# Patient Record
Sex: Male | Born: 1995 | Race: White | Hispanic: No | Marital: Married | Smoking: Former smoker
Health system: Southern US, Community
[De-identification: ages and names within clinical notes are randomized; demographics above are authoritative.]

## PROBLEM LIST (undated history)

## (undated) DIAGNOSIS — S060XAA Concussion with loss of consciousness status unknown, initial encounter: Secondary | ICD-10-CM

## (undated) DIAGNOSIS — S060X9A Concussion with loss of consciousness of unspecified duration, initial encounter: Secondary | ICD-10-CM

---
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
## 2016-01-12 ENCOUNTER — Emergency Department (HOSPITAL_COMMUNITY)
Admission: EM | Admit: 2016-01-12 | Discharge: 2016-01-13 | Disposition: A | Discharge status: Home / Self Care | Payer: 59 | Attending: Emergency Medicine | Admitting: Emergency Medicine

## 2016-01-12 ENCOUNTER — Encounter (HOSPITAL_COMMUNITY): Payer: Self-pay | Admitting: *Deleted

## 2016-01-12 DIAGNOSIS — H6593 Unspecified nonsuppurative otitis media, bilateral: Secondary | ICD-10-CM | POA: Diagnosis not present

## 2016-01-12 DIAGNOSIS — J069 Acute upper respiratory infection, unspecified: Secondary | ICD-10-CM | POA: Diagnosis not present

## 2016-01-12 DIAGNOSIS — F17210 Nicotine dependence, cigarettes, uncomplicated: Secondary | ICD-10-CM | POA: Diagnosis not present

## 2016-01-12 DIAGNOSIS — J45901 Unspecified asthma with (acute) exacerbation: Secondary | ICD-10-CM | POA: Insufficient documentation

## 2016-01-12 DIAGNOSIS — Z792 Long term (current) use of antibiotics: Secondary | ICD-10-CM | POA: Diagnosis not present

## 2016-01-12 DIAGNOSIS — Z79899 Other long term (current) drug therapy: Secondary | ICD-10-CM | POA: Diagnosis not present

## 2016-01-12 DIAGNOSIS — R05 Cough: Secondary | ICD-10-CM | POA: Diagnosis not present

## 2016-01-12 DIAGNOSIS — B9789 Other viral agents as the cause of diseases classified elsewhere: Secondary | ICD-10-CM

## 2016-01-12 NOTE — ED Triage Notes (Signed)
Pt c/o cough with some sob and congestion x 1 day

## 2016-01-13 ENCOUNTER — Emergency Department (HOSPITAL_COMMUNITY): Payer: 59

## 2016-01-13 DIAGNOSIS — Z792 Long term (current) use of antibiotics: Secondary | ICD-10-CM | POA: Diagnosis not present

## 2016-01-13 DIAGNOSIS — R05 Cough: Secondary | ICD-10-CM | POA: Diagnosis not present

## 2016-01-13 DIAGNOSIS — H6593 Unspecified nonsuppurative otitis media, bilateral: Secondary | ICD-10-CM | POA: Diagnosis not present

## 2016-01-13 DIAGNOSIS — Z79899 Other long term (current) drug therapy: Secondary | ICD-10-CM | POA: Diagnosis not present

## 2016-01-13 DIAGNOSIS — J069 Acute upper respiratory infection, unspecified: Secondary | ICD-10-CM | POA: Diagnosis not present

## 2016-01-13 DIAGNOSIS — J45901 Unspecified asthma with (acute) exacerbation: Secondary | ICD-10-CM | POA: Diagnosis not present

## 2016-01-13 DIAGNOSIS — F17210 Nicotine dependence, cigarettes, uncomplicated: Secondary | ICD-10-CM | POA: Diagnosis not present

## 2016-01-13 MED ORDER — AMOXICILLIN-POT CLAVULANATE 875-125 MG PO TABS
1.0000 | ORAL_TABLET | Freq: Once | ORAL | Status: AC
  Administered 2016-01-13: 1 via ORAL
  Filled 2016-01-13: qty 1

## 2016-01-13 MED ORDER — ALBUTEROL SULFATE HFA 108 (90 BASE) MCG/ACT IN AERS: 1.0000 | INHALATION_SPRAY | RESPIRATORY_TRACT | 0 refills | Status: DC | PRN

## 2016-01-13 MED ORDER — PREDNISONE 20 MG PO TABS: 40.0000 mg | ORAL_TABLET | Freq: Every day | ORAL | 0 refills | Status: DC

## 2016-01-13 MED ORDER — AMOXICILLIN-POT CLAVULANATE 875-125 MG PO TABS: 1.0000 | ORAL_TABLET | Freq: Two times a day (BID) | ORAL | 0 refills | Status: DC

## 2016-01-13 MED ORDER — DEXTROMETHORPHAN-GUAIFENESIN 15-400 MG PO TABS: 1.0000 | ORAL_TABLET | ORAL | 0 refills | Status: DC

## 2016-01-13 NOTE — ED Provider Notes (Signed)
[facility]-EMER[facility]Y DEPT Provider Note   CSN: [id number] Arrival date & time: 01/12/16  2330     History   Chief Complaint Chief Complaint  Patient presents with  . Cough    HPI  Alexander Villanueva is a 20 y.o. male who complains of congestion, sore throat, dry cough, myalgias, chills, bilateral ear fullness, pressure and hoarseness for 1 week. He denies a history of fevers, rash  and shortness of breath and has a history of asthma. Patient admits to smoking cigarettes.     HPI  Past Medical History:  Diagnosis Date  . Asthma   . Concussion     There are no active problems to display for this patient.   History reviewed. No pertinent surgical history.     Home Medications    Prior to Admission medications   Medication Sig Start Date End Date Taking? Authorizing Provider  acetaminophen (TYLENOL) 325 MG tablet Take 650 mg by mouth every 6 (six) hours as needed for fever.    Historical Provider, MD  albuterol (PROVENTIL HFA;VENTOLIN HFA) 108 (90 Base) MCG/ACT inhaler Inhale 1-2 puffs into the lungs every 4 (four) hours as needed for wheezing or shortness of breath. 01/13/16   Arthor Captain, PA-C  amoxicillin-clavulanate (AUGMENTIN) 875-125 MG tablet Take 1 tablet by mouth 2 (two) times daily. One po bid x 7 days 01/13/16   Arthor Captain, PA-C  chlorpheniramine-HYDROcodone [facility] ER) 10-8 MG/5ML LQCR Take 5 mLs by mouth every 12 (twelve) hours as needed for cough. Patient not taking: Reported on 09/24/2014 07/06/14   Janne Napoleon, NP  Dextromethorphan-Guaifenesin 15-400 MG TABS Take 1 tablet by mouth every 4 (four) hours. 01/13/16   Delenn Ahn, PA-C  ondansetron (ZOFRAN ODT) 4 MG disintegrating tablet Take 1 tablet (4 mg total) by mouth every 8 (eight) hours as needed for nausea or vomiting. 02/25/15   Laurence Spates, MD  predniSONE (DELTASONE) 20 MG tablet Take 2 tablets (40 mg total) by mouth daily. 01/13/16   Arthor Captain, PA-C    Family  History History reviewed. No pertinent family history.  Social History Social History  Substance Use Topics  . Smoking status: Current Some Day Smoker    Packs/day: 0.50    Types: Cigarettes  . Smokeless tobacco: Never Used  . Alcohol use No     Allergies   Review of patient's allergies indicates no known allergies.   Review of Systems Review of Systems  Ten systems reviewed and are negative for acute change, except as noted in the HPI.   Physical Exam Updated Vital Signs BP 115/81 (BP Location: Right Arm)   Pulse 95   Temp 99.4 F (37.4 C) (Oral)   Resp 20   Ht 5\' 7"  (1.702 m)   Wt 122.5 kg   SpO2 95%   BMI 42.29 kg/m   Physical Exam  Constitutional: He is oriented to person, place, and time. He appears well-developed and well-nourished. No distress.  HENT:  Head: Normocephalic and atraumatic.  Right Ear: A middle ear effusion is present.  Left Ear: A middle ear effusion is present.  Mouth/Throat: Uvula is midline. Posterior oropharyngeal erythema present. No oropharyngeal exudate or posterior oropharyngeal edema.  Eyes: Conjunctivae and EOM are normal. Pupils are equal, round, and reactive to light. No scleral icterus.  Neck: Normal range of motion. Neck supple.  Cardiovascular: Normal rate, regular rhythm and normal heart sounds.   Pulmonary/Chest: Effort normal and breath sounds normal. No respiratory distress. He has no wheezes.  Abdominal: Soft. He exhibits no distension. There is no tenderness.  Musculoskeletal: He exhibits no edema.  Neurological: He is alert and oriented to person, place, and time.  Skin: Skin is warm and dry. He is not diaphoretic.  Psychiatric: His behavior is normal.  Nursing note and vitals reviewed.    ED Treatments / Results  Labs (all labs ordered are listed, but only abnormal results are displayed) Labs Reviewed - No data to display  EKG  EKG Interpretation None       Radiology No results  found.  Procedures Procedures (including critical care time)  Medications Ordered in ED Medications - No data to display   Initial Impression / Assessment and Plan / ED Course  I have reviewed the triage vital signs and the nursing notes.  Pertinent labs & imaging results that were available during my care of the patient were reviewed by me and considered in my medical decision making (see chart for details).  Clinical Course    Pt CXR negative for acute infiltrate. Patients symptoms are consistent with URI, likely viral etiology. Patient  exam also consistent with serous otitis media. No concern for acute mastoiditis, meningitis. n.  Patient discharged home with Augmentin.  Advised to call PCP today for follow-up.  I have also discussed reasons to return immediately to the ER.  Patient expresses understanding and agrees with plan.      Final Clinical Impressions(s) / ED Diagnoses   Final diagnoses:  Viral URI with cough  Asthma exacerbation  OME (otitis media with effusion), bilateral    New Prescriptions New Prescriptions   AMOXICILLIN-CLAVULANATE (AUGMENTIN) 875-125 MG TABLET    Take 1 tablet by mouth 2 (two) times daily. One po bid x 7 days   DEXTROMETHORPHAN-GUAIFENESIN 15-400 MG TABS    Take 1 tablet by mouth every 4 (four) hours.   PREDNISONE (DELTASONE) 20 MG TABLET    Take 2 tablets (40 mg total) by mouth daily.     Arthor CaptainAbigail Javonnie Illescas, PA-C 01/13/16 0129    Geoffery Lyonsouglas Delo, MD 01/14/16 [phone]

## 2016-01-13 NOTE — ED Notes (Signed)
Pt reports that he works in [employer] service- That he has had a cough since Friday that is unproductive and his head feels tightness all over- He is non tender to palpation over his sinus area, His lungs are diminished L greater than R base

## 2016-01-13 NOTE — ED Notes (Signed)
Patient transported to X-ray 

## 2016-01-13 NOTE — ED Notes (Signed)
From Xray 

## 2016-01-13 NOTE — Discharge Instructions (Signed)
Your chest xray appears unremarkable. Your antibiotic should cover the ear infection, but also has good coverage for bacterial pneumonia. Take the entire course of the antibiotics. Follow up with your primary care doctor this week if you are not improving or worsening

## 2016-01-21 DIAGNOSIS — E559 Vitamin D deficiency, unspecified: Secondary | ICD-10-CM | POA: Diagnosis not present

## 2016-01-21 DIAGNOSIS — F17200 Nicotine dependence, unspecified, uncomplicated: Secondary | ICD-10-CM | POA: Diagnosis not present

## 2016-01-21 DIAGNOSIS — Z713 Dietary counseling and surveillance: Secondary | ICD-10-CM | POA: Diagnosis not present

## 2016-01-21 DIAGNOSIS — R945 Abnormal results of liver function studies: Secondary | ICD-10-CM | POA: Diagnosis not present

## 2016-01-21 DIAGNOSIS — R5383 Other fatigue: Secondary | ICD-10-CM | POA: Diagnosis not present

## 2016-05-24 ENCOUNTER — Emergency Department (HOSPITAL_COMMUNITY)
Admission: EM | Admit: 2016-05-24 | Discharge: 2016-05-24 | Disposition: A | Discharge status: Home / Self Care | Payer: 59 | Attending: Emergency Medicine | Admitting: Emergency Medicine

## 2016-05-24 ENCOUNTER — Encounter (HOSPITAL_COMMUNITY): Payer: Self-pay | Admitting: Emergency Medicine

## 2016-05-24 DIAGNOSIS — J069 Acute upper respiratory infection, unspecified: Secondary | ICD-10-CM | POA: Diagnosis not present

## 2016-05-24 DIAGNOSIS — R05 Cough: Secondary | ICD-10-CM | POA: Diagnosis not present

## 2016-05-24 DIAGNOSIS — J45909 Unspecified asthma, uncomplicated: Secondary | ICD-10-CM | POA: Insufficient documentation

## 2016-05-24 DIAGNOSIS — Z79899 Other long term (current) drug therapy: Secondary | ICD-10-CM | POA: Insufficient documentation

## 2016-05-24 DIAGNOSIS — F17210 Nicotine dependence, cigarettes, uncomplicated: Secondary | ICD-10-CM | POA: Diagnosis not present

## 2016-05-24 DIAGNOSIS — B9789 Other viral agents as the cause of diseases classified elsewhere: Secondary | ICD-10-CM

## 2016-05-24 MED ORDER — ONDANSETRON HCL 4 MG PO TABS
4.0000 mg | ORAL_TABLET | Freq: Once | ORAL | Status: AC
  Administered 2016-05-24: 4 mg via ORAL
  Filled 2016-05-24: qty 1

## 2016-05-24 MED ORDER — DEXAMETHASONE 4 MG PO TABS: 4.0000 mg | ORAL_TABLET | Freq: Two times a day (BID) | ORAL | 0 refills | Status: DC

## 2016-05-24 MED ORDER — LORATADINE-PSEUDOEPHEDRINE ER 5-120 MG PO TB12: 1.0000 | ORAL_TABLET | Freq: Two times a day (BID) | ORAL | 0 refills | Status: DC

## 2016-05-24 MED ORDER — PREDNISONE 20 MG PO TABS
40.0000 mg | ORAL_TABLET | Freq: Once | ORAL | Status: AC
  Administered 2016-05-24: 40 mg via ORAL
  Filled 2016-05-24: qty 2

## 2016-05-24 MED ORDER — PROMETHAZINE-DM 6.25-15 MG/5ML PO SYRP: 5.0000 mL | ORAL_SOLUTION | Freq: Four times a day (QID) | ORAL | 0 refills | Status: DC | PRN

## 2016-05-24 MED ORDER — HYDROCOD POLST-CPM POLST ER 10-8 MG/5ML PO SUER
5.0000 mL | Freq: Once | ORAL | Status: AC
  Administered 2016-05-24: 5 mL via ORAL
  Filled 2016-05-24: qty 5

## 2016-05-24 NOTE — ED Triage Notes (Signed)
Cough x 4 days. Denies productive cough. Sweating, has not checked temp. Body aches.

## 2016-05-24 NOTE — ED Notes (Signed)
Pt reports dry, non-productive cough and congestion X4 days. Use OTC medications without improvement. One episode of emesis after having to cough. PO fluids tolerated.

## 2016-05-24 NOTE — ED Provider Notes (Signed)
[facility] Provider Note   CSN: [id number] Arrival date & time: 05/24/16  1331  By signing my name below, I, Cynda Acres, attest that this documentation has been prepared under the direction and in the presence of . Electronically Signed: Cynda Acres, Scribe. 05/24/16. 3:30 PM.   History   Chief Complaint Chief Complaint  Patient presents with  . Cough    HPI Comments: Alexander Villanueva is a 21 y.o. male with a hx of asthma, who presents to the Emergency Department complaining of a dry, non-productive cough that began 4 days ago. Patient has associate chest pain, wheezing, and an ear ache due to cough. Father reports he usually needs a steroid when he gets sick. Patient has been using Mucinex with no improvement. Patient denies phlegm production, hemoptysis, diarrhea, or unusual rash.   HPI  Past Medical History:  Diagnosis Date  . Asthma   . Concussion     There are no active problems to display for this patient.   History reviewed. No pertinent surgical history.     Home Medications    Prior to Admission medications   Medication Sig Start Date End Date Taking? Authorizing Provider  acetaminophen (TYLENOL) 325 MG tablet Take 650 mg by mouth every 6 (six) hours as needed for fever.    Historical Provider, MD  albuterol (PROVENTIL HFA;VENTOLIN HFA) 108 (90 Base) MCG/ACT inhaler Inhale 1-2 puffs into the lungs every 4 (four) hours as needed for wheezing or shortness of breath. 01/13/16   Arthor Captain, PA-C  amoxicillin-clavulanate (AUGMENTIN) 875-125 MG tablet Take 1 tablet by mouth 2 (two) times daily. One po bid x 7 days 01/13/16   Arthor Captain, PA-C  chlorpheniramine-HYDROcodone [facility] ER) 10-8 MG/5ML LQCR Take 5 mLs by mouth every 12 (twelve) hours as needed for cough. Patient not taking: Reported on 09/24/2014 07/06/14   Janne Napoleon, NP  Dextromethorphan-Guaifenesin 15-400 MG TABS Take 1 tablet by mouth every 4 (four) hours. 01/13/16   Abigail  Harris, PA-C  ondansetron (ZOFRAN ODT) 4 MG disintegrating tablet Take 1 tablet (4 mg total) by mouth every 8 (eight) hours as needed for nausea or vomiting. 02/25/15   Laurence Spates, MD  predniSONE (DELTASONE) 20 MG tablet Take 2 tablets (40 mg total) by mouth daily. 01/13/16   Arthor Captain, PA-C    Family History History reviewed. No pertinent family history.  Social History Social History  Substance Use Topics  . Smoking status: Current Some Day Smoker    Packs/day: 0.50    Types: Cigarettes  . Smokeless tobacco: Never Used  . Alcohol use No     Allergies   Patient has no known allergies.   Review of Systems Review of Systems   Physical Exam Updated Vital Signs BP 125/74   Pulse 87   Temp 98.2 F (36.8 C) (Oral)   Resp 18   Ht 5\' 7"  (1.702 m)   Wt 277 lb (125.6 kg)   SpO2 99%   BMI 43.38 kg/m   Physical Exam  Constitutional: He is oriented to person, place, and time. He appears well-developed.  HENT:  Head: Normocephalic and atraumatic.  Mouth/Throat: Oropharynx is clear and moist.  Uvula enlarged. Airway patent. Nasal congestion is present.    Eyes: Conjunctivae and EOM are normal. Pupils are equal, round, and reactive to light.  Neck: Normal range of motion. Neck supple.  No cervical adenopathy.   Cardiovascular: Normal rate and regular rhythm.   Pulmonary/Chest: Effort normal and breath sounds  normal.  Lungs clear bilaterally.   Abdominal: Soft. Bowel sounds are normal.  Musculoskeletal: Normal range of motion.  Capillary refill is less than two seconds.   Neurological: He is alert and oriented to person, place, and time.  Skin: Skin is warm and dry.  No rash to the palms.   Psychiatric: He has a normal mood and affect.     ED Treatments / Results  DIAGNOSTIC STUDIES: Oxygen Saturation is 99% on RA, normal by my interpretation.    COORDINATION OF CARE: 3:17 PM Discussed treatment plan with pt at bedside and pt agreed to  plan.  Labs (all labs ordered are listed, but only abnormal results are displayed) Labs Reviewed - No data to display  EKG  EKG Interpretation None       Radiology No results found.  Procedures Procedures (including critical care time)  Medications Ordered in ED Medications - No data to display   Initial Impression / Assessment and Plan / ED Course  I have reviewed the triage vital signs and the nursing notes.  Pertinent labs & imaging results that were available during my care of the patient were reviewed by me and considered in my medical decision making (see chart for details).     **I have reviewed nursing notes, vital signs, and all appropriate lab and imaging results for this patient.*  Final Clinical Impressions(s) / ED Diagnoses   MDM: No high fevers. Cough but no hypophysis. Suspect upper respiratory infection. Patient will be treated with decongestants, steroid medication, and cough medication. Discussed hydration and hand washing procedures. He will follow-up with his PCP or return to the ED if there are any problems. Patient and father are in agreement with the plan.   Final diagnoses:  Viral URI with cough    New Prescriptions New Prescriptions   No medications on file   **I personally performed the services described in this documentation, which was scribed in my presence. The recorded information has been reviewed and is accurate.Ivery Quale*    Evelio Rueda, PA-C 05/24/16 1545    Marily MemosJason Mesner, MD 05/24/16 [phone]

## 2016-05-24 NOTE — Discharge Instructions (Signed)
Please use Claritin-D and Decadron 2 times daily with food. Use promethazine DM for cough if needed. This medication may cause drowsiness, please use it with caution. Please increase water, juices, Gatorade, etc. Wash hands frequently. Use your mask until symptoms have resolved.

## 2016-08-27 DIAGNOSIS — L03116 Cellulitis of left lower limb: Secondary | ICD-10-CM | POA: Diagnosis not present

## 2016-09-22 DIAGNOSIS — L03116 Cellulitis of left lower limb: Secondary | ICD-10-CM | POA: Diagnosis not present

## 2016-09-22 DIAGNOSIS — R5383 Other fatigue: Secondary | ICD-10-CM | POA: Diagnosis not present

## 2016-09-22 DIAGNOSIS — Z131 Encounter for screening for diabetes mellitus: Secondary | ICD-10-CM | POA: Diagnosis not present

## 2016-09-22 DIAGNOSIS — R945 Abnormal results of liver function studies: Secondary | ICD-10-CM | POA: Diagnosis not present

## 2016-09-22 DIAGNOSIS — E559 Vitamin D deficiency, unspecified: Secondary | ICD-10-CM | POA: Diagnosis not present

## 2016-10-11 IMAGING — DX DG CHEST 2V
2 series · 2 of 2 positions shown · non-contrast
Comparison: 02/25/2015

CLINICAL DATA: Cough and congestion for 1 week, worsening.

EXAM:
CHEST  2 VIEW

[chest pa]
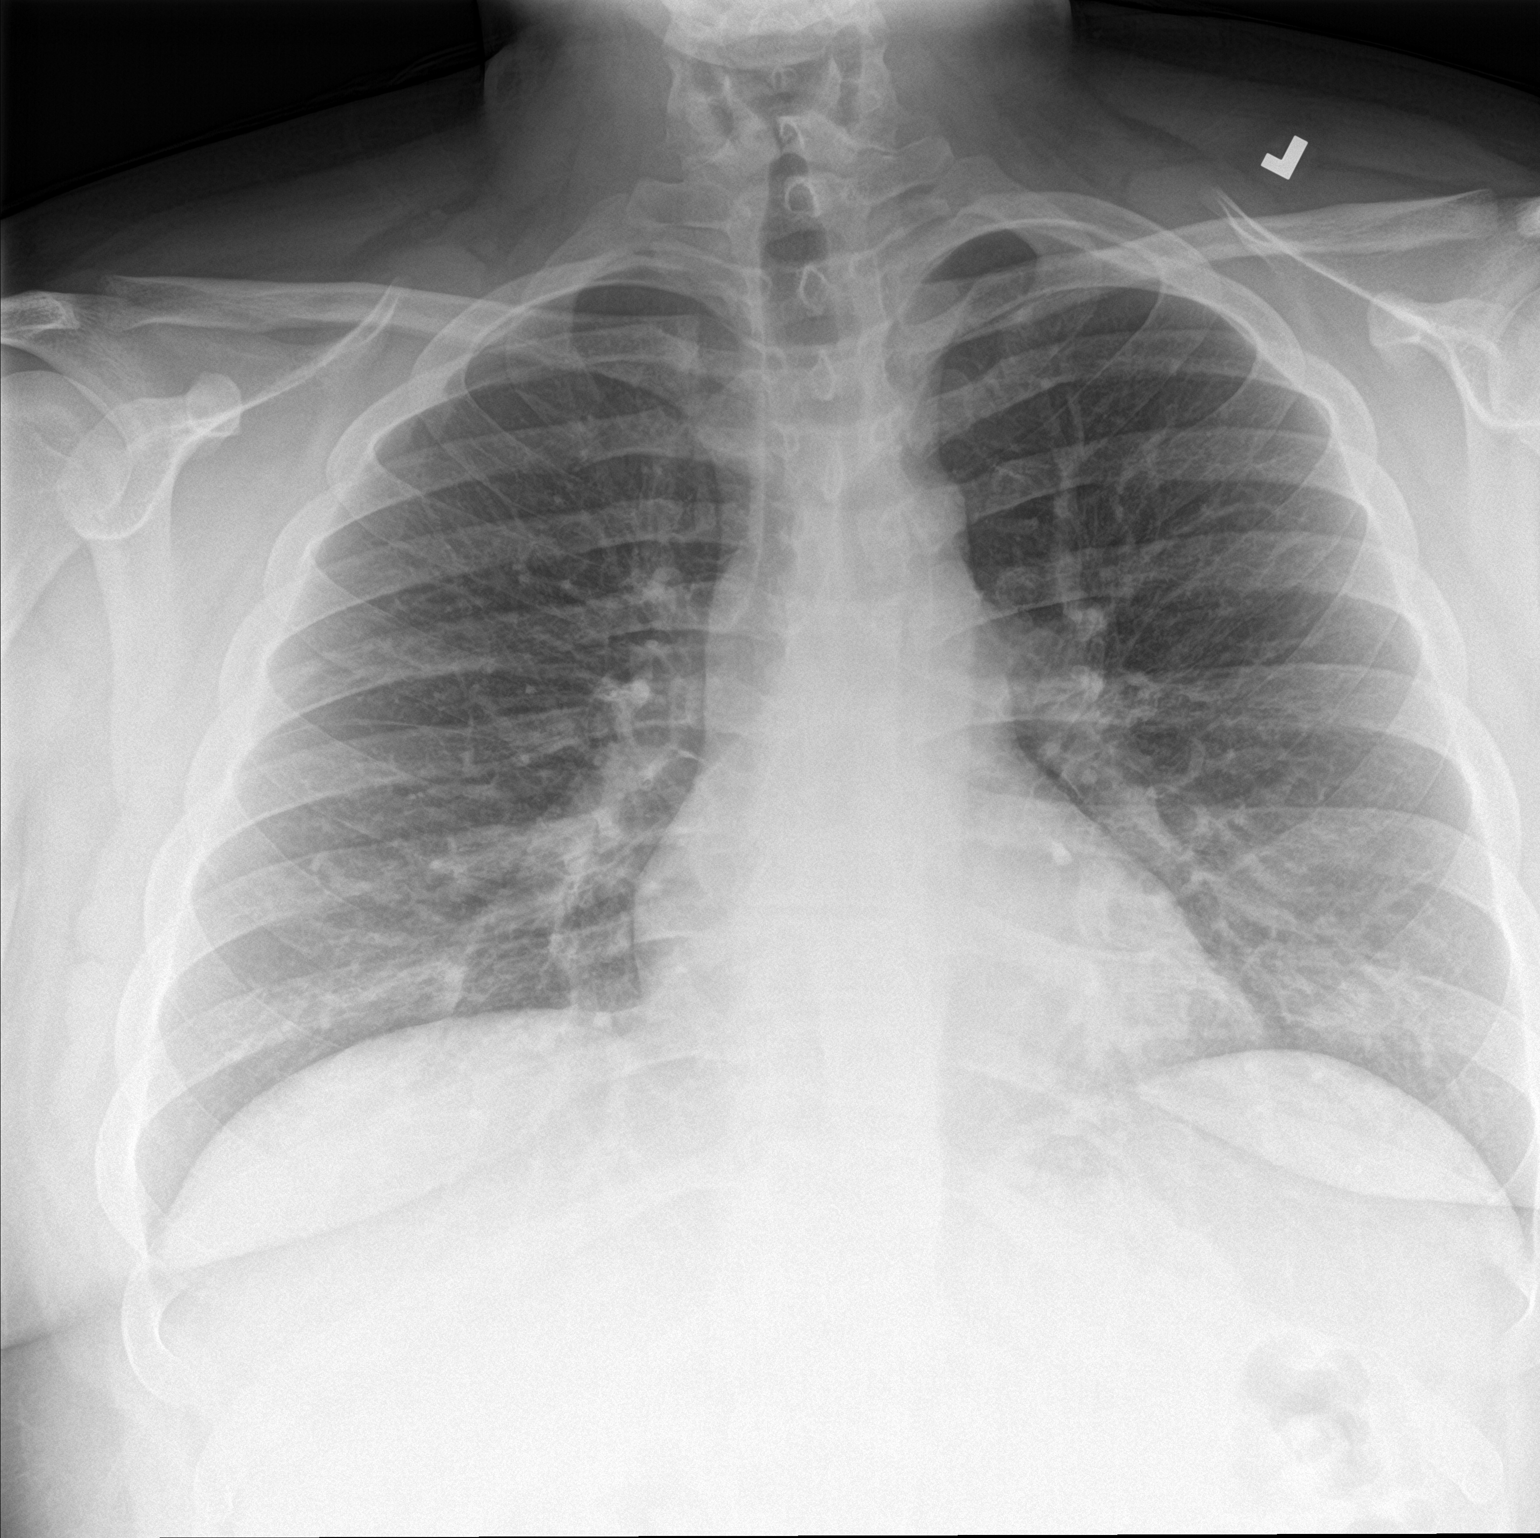

[chest lat]
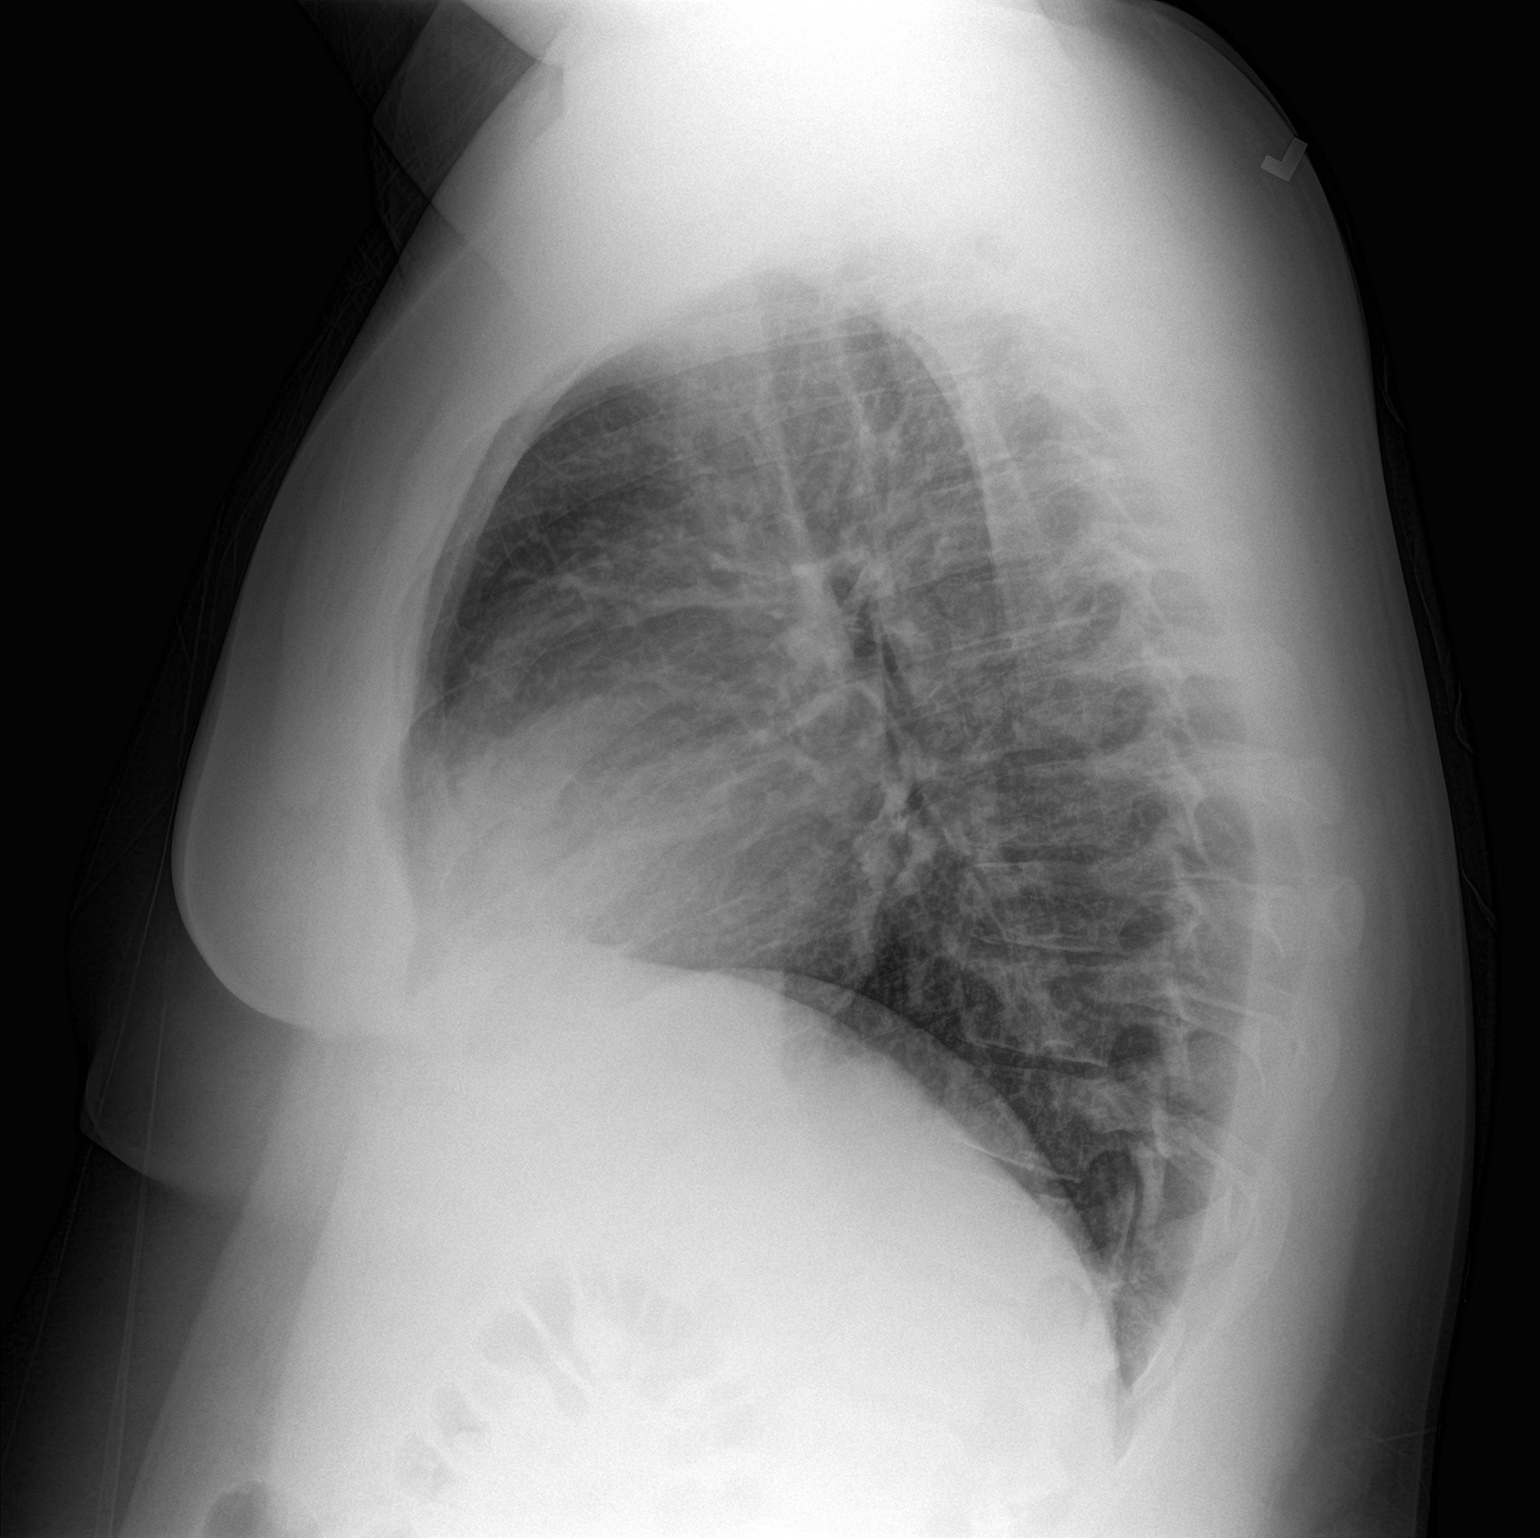

[2 of 2 positions shown; findings below may reference images not displayed]

FINDINGS: Normal heart size and pulmonary vascularity. Mediastinal contours
appear intact. Streaky perihilar opacities with peribronchial
thickening suggesting bronchitis or airways disease. No focal
consolidation. No pleural effusions. No pneumothorax.
IMPRESSION: Perihilar and peribronchial opacities suggesting bronchitis or
airways disease. No focal consolidation.

## 2016-10-13 DIAGNOSIS — R5383 Other fatigue: Secondary | ICD-10-CM | POA: Diagnosis not present

## 2016-10-13 DIAGNOSIS — E559 Vitamin D deficiency, unspecified: Secondary | ICD-10-CM | POA: Diagnosis not present

## 2017-07-04 ENCOUNTER — Encounter (HOSPITAL_COMMUNITY): Payer: Self-pay | Admitting: Emergency Medicine

## 2017-07-04 ENCOUNTER — Emergency Department (HOSPITAL_COMMUNITY)
Admission: EM | Admit: 2017-07-04 | Discharge: 2017-07-05 | Disposition: A | Discharge status: Home / Self Care | Payer: Self-pay | Attending: Emergency Medicine | Admitting: Emergency Medicine

## 2017-07-04 DIAGNOSIS — J45909 Unspecified asthma, uncomplicated: Secondary | ICD-10-CM | POA: Insufficient documentation

## 2017-07-04 DIAGNOSIS — Z79899 Other long term (current) drug therapy: Secondary | ICD-10-CM | POA: Insufficient documentation

## 2017-07-04 DIAGNOSIS — R600 Localized edema: Secondary | ICD-10-CM | POA: Insufficient documentation

## 2017-07-04 DIAGNOSIS — F17210 Nicotine dependence, cigarettes, uncomplicated: Secondary | ICD-10-CM | POA: Insufficient documentation

## 2017-07-04 DIAGNOSIS — K1120 Sialoadenitis, unspecified: Secondary | ICD-10-CM | POA: Insufficient documentation

## 2017-07-04 NOTE — ED Triage Notes (Signed)
Pt went to bed last night and noticed his R. Earlobe was slightly swollen. Woke up today with increased swelling to R earlobe as well as to face. Pt's face red and hive-like along neck.

## 2017-07-05 MED ORDER — CLINDAMYCIN HCL 150 MG PO CAPS
300.0000 mg | ORAL_CAPSULE | Freq: Once | ORAL | Status: AC
  Administered 2017-07-05: 300 mg via ORAL
  Filled 2017-07-05: qty 2

## 2017-07-05 MED ORDER — DEXAMETHASONE 4 MG PO TABS
8.0000 mg | ORAL_TABLET | Freq: Once | ORAL | Status: AC
  Administered 2017-07-05: 8 mg via ORAL
  Filled 2017-07-05: qty 2

## 2017-07-05 MED ORDER — CLINDAMYCIN HCL 300 MG PO CAPS: 300.0000 mg | ORAL_CAPSULE | Freq: Three times a day (TID) | ORAL | 0 refills | Status: DC

## 2017-07-05 NOTE — ED Provider Notes (Signed)
[facility]ERGENCY [facility]PARTMENT Provider Note   CSN: [id number] Arrival date & time: 07/04/17  2347     History   Chief Complaint Chief Complaint  Patient presents with  . Allergic Reaction    HPI Ozella AlmondRobert C Armistead is a 22 y.o. male.  The history is provided by the patient.  Allergic Reaction  Presenting symptoms: rash and swelling   Presenting symptoms: no itching   Severity:  Moderate Duration:  24 hours Prior allergic episodes:  No prior episodes Relieved by:  Nothing Worsened by:  Nothing Ineffective treatments:  Antihistamines  Patient presents for concern for possible allergic reaction.  He reports over 24 hours ago been having right earlobe swelling.  Today noticed swelling to his right face.  No fevers no vomiting.  No difficulty breathing. No tongue swelling.  No lip swelling.  No dental pain.  No visual change. It is unknown what could have caused this.  No new medications other than Benadryl which is not improving his symptoms Past Medical History:  Diagnosis Date  . Asthma   . Concussion     There are no active problems to display for this patient.   History reviewed. No pertinent surgical history.     Home Medications    Prior to Admission medications   Medication Sig Start Date End Date Taking? Authorizing Provider  albuterol (PROVENTIL HFA;VENTOLIN HFA) 108 (90 Base) MCG/ACT inhaler Inhale 1-2 puffs into the lungs every 4 (four) hours as needed for wheezing or shortness of breath. 01/13/16  Yes Harris, Abigail, PA-C  clindamycin (CLEOCIN) 300 MG capsule Take 1 capsule (300 mg total) by mouth 3 (three) times daily. X 7 days 07/05/17   Zadie RhineWickline, Annah Jasko, MD  loratadine-pseudoephedrine (CLARITIN-D 12 HOUR) 5-120 MG tablet Take 1 tablet by mouth 2 (two) times daily. 05/24/16   Ivery QualeBryant, Hobson, PA-C    Family History No family history on file.  Social History Social History   Tobacco Use  . Smoking status: Current Some Day Smoker    Packs/day: 0.50      Types: Cigarettes  . Smokeless tobacco: Never Used  Substance Use Topics  . Alcohol use: No  . Drug use: No     Allergies   Patient has no known allergies.   Review of Systems Review of Systems  Constitutional: Negative for fever.  Gastrointestinal: Negative for vomiting.  Skin: Positive for rash. Negative for itching.  All other systems reviewed and are negative.    Physical Exam Updated Vital Signs BP 113/72 (BP Location: Left Arm)   Pulse 95   Temp 97.6 F (36.4 C) (Oral)   Resp 18   Ht 1.702 m (5\' 7" )   Wt 131.5 kg (290 lb)   SpO2 98%   BMI 45.42 kg/m   Physical Exam CONSTITUTIONAL: Well developed/well nourished HEAD: Normocephalic/atraumatic EYES: EOMI/PERRL ENMT: Mucous membranes moist, no stridor, no drooling, no trismus, no dental abscess noted, no dental tenderness.  Tenderness and induration noted preauricular space adjacent to parotid gland.  No crepitus.  No fluctuance.  No hives noted to neck NECK: supple no meningeal signs CV: S1/S2 noted, no murmurs/rubs/gallops noted LUNGS: Lungs are clear to auscultation bilaterally, no apparent distress ABDOMEN: soft, nontender NEURO: Pt is awake/alert/appropriate, moves all extremitiesx4.  No facial droop.   EXTREMITIES: full ROM SKIN: warm, color normal PSYCH: no abnormalities of mood noted, alert and oriented to situation   Patient gave verbal permission to utilize photo for medical documentation only The image was not stored on  any personal device  ED Treatments / Results  Labs (all labs ordered are listed, but only abnormal results are displayed) Labs Reviewed - No data to display  EKG  EKG Interpretation None       Radiology No results found.  Procedures Procedures (including critical care time)  Medications Ordered in ED Medications  clindamycin (CLEOCIN) capsule 300 mg (300 mg Oral Given 07/05/17 0024)  dexamethasone (DECADRON) tablet 8 mg (8 mg Oral Given 07/05/17 0057)      Initial Impression / Assessment and Plan / ED Course  I have reviewed the triage vital signs and the nursing notes.       Patient presents with redness and swelling to right side of his face for over 24 hours.  Unknown etiology.  On my exam, he actually has tenderness induration in the preauricular space as well as over the parotid gland is.  Patient had a concern for parotitis No Signs of dental abscess. Initial plan was to start antibiotics and plan for treatment for parotitis This would be an unusual presentation for allergic reaction.  There is no angioedema. After my initial exam, he did note a small patch of erythema to his left wrist, unclear when this started.  No other hives to his body. Plan to use Benadryl at home.  We will give one-time dose of Decadron. I am still strongly suspicious for infectious etiology in the parotid gland.  Will prescribe clindamycin. We discussed strict return precautions  Final Clinical Impressions(s) / ED Diagnoses   Final diagnoses:  Parotiditis    ED Discharge Orders        Ordered    clindamycin (CLEOCIN) 300 MG capsule  3 times daily     07/05/17 0042       Zadie Rhine, MD 07/05/17 0104

## 2017-10-11 ENCOUNTER — Encounter (HOSPITAL_COMMUNITY): Payer: Self-pay | Admitting: Emergency Medicine

## 2017-10-11 ENCOUNTER — Emergency Department (HOSPITAL_COMMUNITY): Payer: Self-pay

## 2017-10-11 ENCOUNTER — Emergency Department (HOSPITAL_COMMUNITY)
Admission: EM | Admit: 2017-10-11 | Discharge: 2017-10-11 | Disposition: A | Discharge status: Home / Self Care | Payer: Self-pay | Attending: Emergency Medicine | Admitting: Emergency Medicine

## 2017-10-11 ENCOUNTER — Other Ambulatory Visit: Payer: Self-pay

## 2017-10-11 DIAGNOSIS — E860 Dehydration: Secondary | ICD-10-CM | POA: Insufficient documentation

## 2017-10-11 DIAGNOSIS — R55 Syncope and collapse: Secondary | ICD-10-CM | POA: Insufficient documentation

## 2017-10-11 DIAGNOSIS — J45909 Unspecified asthma, uncomplicated: Secondary | ICD-10-CM | POA: Insufficient documentation

## 2017-10-11 DIAGNOSIS — F17210 Nicotine dependence, cigarettes, uncomplicated: Secondary | ICD-10-CM | POA: Insufficient documentation

## 2017-10-11 LAB — TROPONIN I

## 2017-10-11 LAB — BASIC METABOLIC PANEL
Anion gap: 9 (ref 5–15)
BUN: 12 mg/dL (ref 6–20)
CO2: 26 mmol/L (ref 22–32)
CREATININE: 0.63 mg/dL (ref 0.61–1.24)
Calcium: 9.6 mg/dL (ref 8.9–10.3)
Chloride: 105 mmol/L (ref 101–111)
GFR calc Af Amer: >60 mL/min (ref >60)
Glucose, Bld: 78 mg/dL (ref 65–99)
Potassium: 3.9 mmol/L (ref 3.5–5.1)
SODIUM: 140 mmol/L (ref 135–145)

## 2017-10-11 LAB — CBC
HEMATOCRIT: 44.1 % (ref 39.0–52.0)
Hemoglobin: 14.5 g/dL (ref 13.0–17.0)
MCH: 26.5 pg (ref 26.0–34.0)
MCHC: 32.9 g/dL (ref 30.0–36.0)
MCV: 80.6 fL (ref 78.0–100.0)
PLATELETS: 342 10*3/uL (ref 150–400)
RBC: 5.47 MIL/uL (ref 4.22–5.81)
RDW: 14.3 % (ref 11.5–15.5)
WBC: 14.2 10*3/uL — AB (ref 4.0–10.5)

## 2017-10-11 LAB — URINALYSIS, ROUTINE W REFLEX MICROSCOPIC
Bilirubin Urine: NEGATIVE
Glucose, UA: NEGATIVE mg/dL
Hgb urine dipstick: NEGATIVE
KETONES UR: NEGATIVE mg/dL
LEUKOCYTES UA: NEGATIVE
NITRITE: NEGATIVE
PROTEIN: NEGATIVE mg/dL
Specific Gravity, Urine: 1.005 (ref 1.005–1.030)
pH: 6 (ref 5.0–8.0)

## 2017-10-11 LAB — CBG MONITORING, ED: GLUCOSE-CAPILLARY: 120 mg/dL — AB (ref 65–99)

## 2017-10-11 LAB — D-DIMER, QUANTITATIVE: D-Dimer, Quant: <0.27 ug/mL-FEU (ref 0.00–0.50)

## 2017-10-11 MED ORDER — SODIUM CHLORIDE 0.9 % IV BOLUS
1000.0000 mL | Freq: Once | INTRAVENOUS | Status: AC
  Administered 2017-10-11: 1000 mL via INTRAVENOUS

## 2017-10-11 NOTE — ED Triage Notes (Signed)
Pt states he was out working in the yard today and began to feel weak and nauseas. Pt states he then "blacked out" and has felt weak ever since then.

## 2017-10-11 NOTE — Discharge Instructions (Signed)

## 2017-10-11 NOTE — ED Notes (Signed)
This nurse attempted IV access  EDP notified, will ask another nurse to try for IV

## 2017-10-11 NOTE — ED Provider Notes (Signed)
Emergency Department Provider Note   I have reviewed the triage vital signs and the nursing notes.   HISTORY  Chief Complaint Loss of Consciousness   HPI Alexander Villanueva is a 22 y.o. male with PMH of asthma presents to the emergency department for evaluation after syncope event.  Patient was working outside in the heat but states he was taking frequent breaks and drinking Gatorade.  He began to feel generally weak and had some nausea.  He felt like his "chest was fluttering" and causing some mild pain at which point he "blacked out."  This was witnessed by his father he denies any seizure activity.  The patient almost immediately regained consciousness but has continued to feel intermittent fluttering in his chest with spots in his vision since the event today.  No prior history of syncope.  No new medications. No radiation of symptoms or modifying factors.   Past Medical History:  Diagnosis Date  . Asthma   . Concussion     There are no active problems to display for this patient.   History reviewed. No pertinent surgical history.  Current Outpatient Rx  . Order #: [id number] Class: Print    Allergies Patient has no known allergies.  No family history on file.  Social History Social History   Tobacco Use  . Smoking status: Current Some Day Smoker    Packs/day: 0.50    Types: Cigarettes  . Smokeless tobacco: Never Used  Substance Use Topics  . Alcohol use: No  . Drug use: No    Review of Systems  Constitutional: No fever/chills Eyes: No visual changes. ENT: No sore throat. Cardiovascular: Positive chest pain. Positive syncope and palpitations.  Respiratory: Denies shortness of breath. Gastrointestinal: No abdominal pain.  No nausea, no vomiting.  No diarrhea.  No constipation. Genitourinary: Negative for dysuria. Musculoskeletal: Negative for back pain. Skin: Negative for rash. Neurological: Negative for headaches, focal weakness or numbness.  10-point  ROS otherwise negative.  ____________________________________________   PHYSICAL EXAM:  VITAL SIGNS: Vitals:   10/11/17 2246 10/11/17 2300  BP: 125/65 113/73  Pulse: 78 69  Resp: 18 (!) 22  SpO2: 99% 98%    Constitutional: Alert and oriented. Well appearing and in no acute distress. Eyes: Conjunctivae are normal.  Head: Atraumatic. Nose: No congestion/rhinnorhea. Mouth/Throat: Mucous membranes are moist.  Oropharynx non-erythematous. Neck: No stridor.  Cardiovascular: Normal rate, regular rhythm. Good peripheral circulation. Grossly normal heart sounds.   Respiratory: Normal respiratory effort.  No retractions. Lungs CTAB. Gastrointestinal: Soft and nontender. No distention.  Musculoskeletal: No lower extremity tenderness nor edema. No gross deformities of extremities. Neurologic:  Normal speech and language. No gross focal neurologic deficits are appreciated.  Skin:  Skin is warm, dry and intact. No rash noted.  ____________________________________________   LABS (all labs ordered are listed, but only abnormal results are displayed)  Labs Reviewed  CBC - Abnormal; Notable for the following components:      Result Value   WBC 14.2 (*)    All other components within normal limits  URINALYSIS, ROUTINE W REFLEX MICROSCOPIC - Abnormal; Notable for the following components:   Color, Urine STRAW (*)    All other components within normal limits  CBG MONITORING, ED - Abnormal; Notable for the following components:   Glucose-Capillary 120 (*)    All other components within normal limits  BASIC METABOLIC PANEL  D-DIMER, QUANTITATIVE (NOT AT [facility])  TROPONIN I   ____________________________________________  EKG  Rate: 82 PR: 138 QTc:  406  Sinus rhythm. Normal axis. Narrow QRS with no ST elevation or depression. Normal T waves. No STEMI.      ____________________________________________  RADIOLOGY  Dg Chest 2 View  Result Date: 10/11/2017 CLINICAL DATA:  Chest  pain while working in yard today. EXAM: CHEST - 2 VIEW COMPARISON:  None. FINDINGS: The heart size and mediastinal contours are within normal limits. Mild left basilar atelectasis. No alveolar consolidation or CHF. No effusion or pneumothorax. Both lungs are clear. The visualized skeletal structures are unremarkable. IMPRESSION: No active cardiopulmonary disease.  Left basilar atelectasis. Electronically Signed   By: Tollie Eth M.D.   On: 10/11/2017 22:03    ____________________________________________   PROCEDURES  Procedure(s) performed:   Procedures  None ____________________________________________   INITIAL IMPRESSION / ASSESSMENT AND PLAN / ED COURSE  Pertinent labs & imaging results that were available during my care of the patient were reviewed by me and considered in my medical decision making (see chart for details).  Department for evaluation after syncope.  Prior to syncope he felt heart palpitations and some chest discomfort along with weakness and nausea.  Like this is most consistent with vasovagal syncope.  Patient has very little cardiovascular risk and no significant family history of sudden cardiac death.  Patient was working out in the heat.  Plan for IV fluids, troponin, d-dimer, chest x-ray, and reassess.  The patient's baseline labs are significant only for a mild leukocytosis which is nonspecific and is on exam or by history to suggest acute infection.   EKG, CXR, and labs reviewed. No acute findings. Patient feeling better after IVF. Provided contact information for Cardiology and placed an ambulatory referral.   At this time, I do not feel there is any life-threatening condition present. I have reviewed and discussed all results (EKG, imaging, lab, urine as appropriate), exam findings with patient. I have reviewed nursing notes and appropriate previous records.  I feel the patient is safe to be discharged home without further emergent workup. Discussed usual and  customary return precautions. Patient and family (if present) verbalize understanding and are comfortable with this plan.  Patient will follow-up with their primary care provider. If they do not have a primary care provider, information for follow-up has been provided to them. All questions have been answered.  ____________________________________________  FINAL CLINICAL IMPRESSION(S) / ED DIAGNOSES  Final diagnoses:  Syncope and collapse  Dehydration     MEDICATIONS GIVEN DURING THIS VISIT:  Medications  sodium chloride 0.9 % bolus 1,000 mL (0 mLs Intravenous Stopped 10/11/17 2313)    Note:  This document was prepared using Dragon voice recognition software and may include unintentional dictation errors.  Alona Bene, MD Emergency Medicine    Long, Arlyss Repress, MD 10/12/17 [phone]

## 2017-10-13 ENCOUNTER — Encounter: Payer: Self-pay | Admitting: Cardiovascular Disease

## 2018-03-20 ENCOUNTER — Other Ambulatory Visit: Payer: Self-pay

## 2018-03-20 ENCOUNTER — Encounter (HOSPITAL_COMMUNITY): Payer: Self-pay | Admitting: *Deleted

## 2018-03-20 ENCOUNTER — Emergency Department (HOSPITAL_COMMUNITY)
Admission: EM | Admit: 2018-03-20 | Discharge: 2018-03-20 | Disposition: A | Discharge status: Home / Self Care | Payer: Self-pay | Attending: Emergency Medicine | Admitting: Emergency Medicine

## 2018-03-20 DIAGNOSIS — F17210 Nicotine dependence, cigarettes, uncomplicated: Secondary | ICD-10-CM | POA: Insufficient documentation

## 2018-03-20 DIAGNOSIS — J45909 Unspecified asthma, uncomplicated: Secondary | ICD-10-CM | POA: Insufficient documentation

## 2018-03-20 DIAGNOSIS — J358 Other chronic diseases of tonsils and adenoids: Secondary | ICD-10-CM

## 2018-03-20 DIAGNOSIS — J351 Hypertrophy of tonsils: Secondary | ICD-10-CM | POA: Insufficient documentation

## 2018-03-20 LAB — GROUP A STREP BY PCR: GROUP A STREP BY PCR: NOT DETECTED

## 2018-03-20 MED ORDER — DEXAMETHASONE SODIUM PHOSPHATE 10 MG/ML IJ SOLN
10.0000 mg | Freq: Once | INTRAMUSCULAR | Status: AC
  Administered 2018-03-20: 10 mg via INTRAMUSCULAR
  Filled 2018-03-20: qty 1

## 2018-03-20 NOTE — Discharge Instructions (Addendum)
Try to stay hydrated. Hopefully the steroid shot will help the swelling of your tonsils. Please call Dr Avel Sensoreoh's office to have him evaluate your tonsils. He is an ENT. Return to the ED if you are unable to swallow, you get a fever or you get a lot of pain.

## 2018-03-20 NOTE — ED Triage Notes (Signed)
Pt c/o trouble swallowing and a feeling of swelling to throat area that started a few days ago, denies any fever,

## 2018-03-20 NOTE — ED Provider Notes (Signed)
[facility] Provider Note   CSN: [id number] Arrival date & time: 03/20/18  0045  Time seen 01:35 AM   History   Chief Complaint Chief Complaint  Patient presents with  . Sore Throat    HPI Alexander Villanueva is a 22 y.o. male.  HPI patient states for the past 2 to 3 days he has noted that his tonsils feel swollen.  He states that they hurt at times.  He does not have pain on swallowing but he states because of the swelling is difficult to swallow.  He denies any fever.  He states when his mouth gets dry or if something cold is in his mouth it makes it worse.  He states he used to get tonsillitis a lot but this seems a little bit different.  He states earlier he had seen a blister on his tonsils and he thought he had tonsil stones because he saw some white areas on his tonsils.  His girlfriend verifies he has a normal voice.  PCP Wilson SingerGosrani, Nimish C, MD   Past Medical History:  Diagnosis Date  . Asthma   . Concussion     There are no active problems to display for this patient.   History reviewed. No pertinent surgical history.      Home Medications    Prior to Admission medications   Medication Sig Start Date End Date Taking? Authorizing Provider  albuterol (PROVENTIL HFA;VENTOLIN HFA) 108 (90 Base) MCG/ACT inhaler Inhale 1-2 puffs into the lungs every 4 (four) hours as needed for wheezing or shortness of breath. 01/13/16   Arthor CaptainHarris, Abigail, PA-C    Family History No family history on file.  Social History Social History   Tobacco Use  . Smoking status: Current Some Day Smoker    Packs/day: 0.50    Types: Cigarettes  . Smokeless tobacco: Never Used  Substance Use Topics  . Alcohol use: No  . 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   Review of Systems Review of Systems  All other systems reviewed and are negative.    Physical Exam Updated Vital Signs BP (!) 145/78   Pulse 74   Temp  98.8 F (37.1 C) (Oral)   Resp 16   Ht 5\' 7"  (1.702 m)   Wt 122.5 kg   SpO2 98%   BMI 42.29 kg/m   Physical Exam  Constitutional: He is oriented to person, place, and time. He appears well-developed and well-nourished. No distress.  HENT:  Head: Normocephalic and atraumatic.  Right Ear: External ear normal.  Left Ear: External ear normal.  Nose: Nose normal.  Mouth/Throat:    Pt has enlarged tonsils, R>L with diffuse redness.  The right appears to have a extra segment on the medial lower aspect.  No soft palate swelling, no uvular swelling or displacement. No exudate.   Eyes: Pupils are equal, round, and reactive to light. Conjunctivae and EOM are normal.  Neck: Normal range of motion.  Cardiovascular: Normal rate.  Pulmonary/Chest: Effort normal. No respiratory distress.  Musculoskeletal: Normal range of motion.  Neurological: He is alert and oriented to person, place, and time. No cranial nerve deficit.  Skin: Skin is warm and dry. No rash noted.  Psychiatric: He has a normal mood and affect. His behavior is normal. Thought content normal.  Nursing note and vitals reviewed.      ED Treatments / Results  Labs (all labs ordered are  listed, but only abnormal results are displayed) Results for orders placed or performed during the hospital encounter of 03/20/18  Group A Strep by PCR  Result Value Ref Range   Group A Strep by PCR NOT DETECTED NOT DETECTED   Laboratory interpretation all normal     EKG None  Radiology No results found.  Procedures Procedures (including critical care time)  Medications Ordered in ED Medications  dexamethasone (DECADRON) injection 10 mg (has no administration in time range)     Initial Impression / Assessment and Plan / ED Course  I have reviewed the triage vital signs and the nursing notes.  Pertinent labs & imaging results that were available during my care of the patient were reviewed by me and considered in my medical  decision making (see chart for details).     Patient strep was negative.  He was given Decadron for tonsillar swelling.  He was referred to Dr. Suszanne Conners, ENT for further evaluation.  He was advised to be rechecked if he gets fever, worsening pain, or if he has trouble swallowing.  Final Clinical Impressions(s) / ED Diagnoses   Final diagnoses:  Tonsil asymmetry  Enlarged tonsils    ED Discharge Orders    None     Plan discharge  Devoria Albe, MD, Concha Pyo, MD 03/20/18 [phone]

## 2018-05-12 ENCOUNTER — Other Ambulatory Visit: Payer: Self-pay

## 2018-05-12 ENCOUNTER — Emergency Department (HOSPITAL_COMMUNITY): Payer: 59

## 2018-05-12 ENCOUNTER — Encounter (HOSPITAL_COMMUNITY): Payer: Self-pay | Admitting: Emergency Medicine

## 2018-05-12 ENCOUNTER — Emergency Department (HOSPITAL_COMMUNITY)
Admission: EM | Admit: 2018-05-12 | Discharge: 2018-05-13 | Disposition: A | Discharge status: Home / Self Care | Payer: 59 | Attending: Emergency Medicine | Admitting: Emergency Medicine

## 2018-05-12 DIAGNOSIS — R51 Headache: Secondary | ICD-10-CM | POA: Diagnosis present

## 2018-05-12 DIAGNOSIS — M545 Low back pain, unspecified: Secondary | ICD-10-CM

## 2018-05-12 DIAGNOSIS — J45909 Unspecified asthma, uncomplicated: Secondary | ICD-10-CM | POA: Insufficient documentation

## 2018-05-12 DIAGNOSIS — S3992XA Unspecified injury of lower back, initial encounter: Secondary | ICD-10-CM | POA: Diagnosis not present

## 2018-05-12 DIAGNOSIS — M546 Pain in thoracic spine: Secondary | ICD-10-CM | POA: Diagnosis not present

## 2018-05-12 DIAGNOSIS — S199XXA Unspecified injury of neck, initial encounter: Secondary | ICD-10-CM | POA: Diagnosis not present

## 2018-05-12 DIAGNOSIS — F17210 Nicotine dependence, cigarettes, uncomplicated: Secondary | ICD-10-CM | POA: Insufficient documentation

## 2018-05-12 DIAGNOSIS — S0990XA Unspecified injury of head, initial encounter: Secondary | ICD-10-CM | POA: Diagnosis not present

## 2018-05-12 DIAGNOSIS — G44209 Tension-type headache, unspecified, not intractable: Secondary | ICD-10-CM | POA: Diagnosis not present

## 2018-05-12 DIAGNOSIS — M542 Cervicalgia: Secondary | ICD-10-CM

## 2018-05-12 MED ORDER — METHOCARBAMOL 500 MG PO TABS
1000.0000 mg | ORAL_TABLET | Freq: Once | ORAL | Status: AC
  Administered 2018-05-12: 1000 mg via ORAL
  Filled 2018-05-12: qty 2

## 2018-05-12 MED ORDER — IBUPROFEN 400 MG PO TABS
600.0000 mg | ORAL_TABLET | Freq: Once | ORAL | Status: AC
  Administered 2018-05-12: 600 mg via ORAL
  Filled 2018-05-12: qty 2
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
## 2018-05-13 DIAGNOSIS — S0990XA Unspecified injury of head, initial encounter: Secondary | ICD-10-CM | POA: Diagnosis not present

## 2018-05-13 DIAGNOSIS — S3992XA Unspecified injury of lower back, initial encounter: Secondary | ICD-10-CM | POA: Diagnosis not present

## 2018-05-13 DIAGNOSIS — S199XXA Unspecified injury of neck, initial encounter: Secondary | ICD-10-CM | POA: Diagnosis not present

## 2018-05-13 DIAGNOSIS — M545 Low back pain: Secondary | ICD-10-CM | POA: Diagnosis not present

## 2018-05-13 MED ORDER — NAPROXEN 500 MG PO TABS: ORAL_TABLET | ORAL | 0 refills | Status: DC

## 2018-05-13 MED ORDER — CYCLOBENZAPRINE HCL 5 MG PO TABS: 5.0000 mg | ORAL_TABLET | Freq: Three times a day (TID) | ORAL | 0 refills | Status: DC | PRN

## 2018-05-13 NOTE — Discharge Instructions (Addendum)
Ice packs to the injured or sore muscles for the next several days for comfort. Take the medications for pain and muscle spasms. Return to the ED for any problems listed on the head injury sheet. Recheck if you aren't improving in the next week.  Call Dr. Mort Sawyers office for an appointment.
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
## 2019-03-10 ENCOUNTER — Other Ambulatory Visit: Payer: Self-pay

## 2019-03-10 DIAGNOSIS — Z20822 Contact with and (suspected) exposure to covid-19: Secondary | ICD-10-CM

## 2019-03-13 LAB — NOVEL CORONAVIRUS, NAA: SARS-CoV-2, NAA: NOT DETECTED

## 2019-03-14 ENCOUNTER — Telehealth: Payer: Self-pay | Admitting: *Deleted

## 2019-04-24 ENCOUNTER — Ambulatory Visit: Payer: HRSA Program | Attending: Internal Medicine

## 2019-04-24 ENCOUNTER — Other Ambulatory Visit: Payer: Self-pay

## 2019-04-24 DIAGNOSIS — Z20822 Contact with and (suspected) exposure to covid-19: Secondary | ICD-10-CM

## 2019-04-24 DIAGNOSIS — Z20828 Contact with and (suspected) exposure to other viral communicable diseases: Secondary | ICD-10-CM | POA: Diagnosis not present

## 2019-04-25 ENCOUNTER — Telehealth: Payer: Self-pay

## 2019-04-25 NOTE — Telephone Encounter (Signed)
Received call from patient checking Covid results.  Advised no results at this time.   

## 2019-04-26 LAB — NOVEL CORONAVIRUS, NAA: SARS-CoV-2, NAA: NOT DETECTED

## 2020-03-27 ENCOUNTER — Emergency Department (HOSPITAL_COMMUNITY): Payer: Medicaid Other

## 2020-03-27 ENCOUNTER — Encounter (HOSPITAL_COMMUNITY): Payer: Self-pay

## 2020-03-27 ENCOUNTER — Other Ambulatory Visit: Payer: Self-pay

## 2020-03-27 ENCOUNTER — Emergency Department (HOSPITAL_COMMUNITY)
Admission: EM | Admit: 2020-03-27 | Discharge: 2020-03-27 | Disposition: A | Discharge status: Home / Self Care | Payer: Medicaid Other | Attending: Emergency Medicine | Admitting: Emergency Medicine

## 2020-03-27 DIAGNOSIS — F17210 Nicotine dependence, cigarettes, uncomplicated: Secondary | ICD-10-CM | POA: Diagnosis not present

## 2020-03-27 DIAGNOSIS — Z20822 Contact with and (suspected) exposure to covid-19: Secondary | ICD-10-CM | POA: Insufficient documentation

## 2020-03-27 DIAGNOSIS — R519 Headache, unspecified: Secondary | ICD-10-CM | POA: Diagnosis present

## 2020-03-27 DIAGNOSIS — B349 Viral infection, unspecified: Secondary | ICD-10-CM | POA: Diagnosis not present

## 2020-03-27 DIAGNOSIS — J45909 Unspecified asthma, uncomplicated: Secondary | ICD-10-CM | POA: Insufficient documentation

## 2020-03-27 LAB — COMPREHENSIVE METABOLIC PANEL
ALT: 62 U/L — ABNORMAL HIGH (ref 0–44)
AST: 35 U/L (ref 15–41)
Albumin: 3.8 g/dL (ref 3.5–5.0)
Alkaline Phosphatase: 59 U/L (ref 38–126)
Anion gap: 8 (ref 5–15)
BUN: 7 mg/dL (ref 6–20)
CO2: 24 mmol/L (ref 22–32)
Calcium: 8.6 mg/dL — ABNORMAL LOW (ref 8.9–10.3)
Chloride: 102 mmol/L (ref 98–111)
Creatinine, Ser: 0.71 mg/dL (ref 0.61–1.24)
GFR, Estimated: >60 mL/min (ref >60)
Glucose, Bld: 97 mg/dL (ref 70–99)
Potassium: 3.3 mmol/L — ABNORMAL LOW (ref 3.5–5.1)
Sodium: 134 mmol/L — ABNORMAL LOW (ref 135–145)
Total Bilirubin: 0.6 mg/dL (ref 0.3–1.2)
Total Protein: 7.4 g/dL (ref 6.5–8.1)

## 2020-03-27 LAB — CBC WITH DIFFERENTIAL/PLATELET
Abs Immature Granulocytes: 0.05 10*3/uL (ref 0.00–0.07)
Basophils Absolute: 0.1 10*3/uL (ref 0.0–0.1)
Basophils Relative: 1 %
Eosinophils Absolute: 0.1 10*3/uL (ref 0.0–0.5)
Eosinophils Relative: 1 %
HCT: 43.5 % (ref 39.0–52.0)
Hemoglobin: 14.1 g/dL (ref 13.0–17.0)
Immature Granulocytes: 1 %
Lymphocytes Relative: 44 %
Lymphs Abs: 4.6 10*3/uL — ABNORMAL HIGH (ref 0.7–4.0)
MCH: 25.9 pg — ABNORMAL LOW (ref 26.0–34.0)
MCHC: 32.4 g/dL (ref 30.0–36.0)
MCV: 79.8 fL — ABNORMAL LOW (ref 80.0–100.0)
Monocytes Absolute: 1 10*3/uL (ref 0.1–1.0)
Monocytes Relative: 9 %
Neutro Abs: 4.8 10*3/uL (ref 1.7–7.7)
Neutrophils Relative %: 44 %
Platelets: 227 10*3/uL (ref 150–400)
RBC: 5.45 MIL/uL (ref 4.22–5.81)
RDW: 14.9 % (ref 11.5–15.5)
WBC: 10.6 10*3/uL — ABNORMAL HIGH (ref 4.0–10.5)
nRBC: 0 % (ref 0.0–0.2)

## 2020-03-27 LAB — RESP PANEL BY RT-PCR (FLU A&B, COVID) ARPGX2
Influenza A by PCR: NEGATIVE
Influenza B by PCR: NEGATIVE
SARS Coronavirus 2 by RT PCR: NEGATIVE

## 2020-03-27 LAB — URINALYSIS, ROUTINE W REFLEX MICROSCOPIC
Bilirubin Urine: NEGATIVE
Glucose, UA: NEGATIVE mg/dL
Hgb urine dipstick: NEGATIVE
Ketones, ur: NEGATIVE mg/dL
Leukocytes,Ua: NEGATIVE
Nitrite: NEGATIVE
Protein, ur: NEGATIVE mg/dL
Specific Gravity, Urine: 1.018 (ref 1.005–1.030)
pH: 5 (ref 5.0–8.0)

## 2020-03-27 MED ORDER — KETOROLAC TROMETHAMINE 30 MG/ML IJ SOLN
30.0000 mg | Freq: Once | INTRAMUSCULAR | Status: AC
  Administered 2020-03-27: 30 mg via INTRAVENOUS
  Filled 2020-03-27: qty 1

## 2020-03-27 MED ORDER — SODIUM CHLORIDE 0.9 % IV BOLUS
1000.0000 mL | Freq: Once | INTRAVENOUS | Status: AC
  Administered 2020-03-27: 1000 mL via INTRAVENOUS

## 2020-03-27 MED ORDER — DOXYCYCLINE HYCLATE 100 MG PO CAPS: 100.0000 mg | ORAL_CAPSULE | Freq: Two times a day (BID) | ORAL | 0 refills | Status: DC

## 2020-03-27 NOTE — ED Provider Notes (Signed)
[facility] EMERGENCY DEPARTMENT Provider Note   CSN: [id number] Arrival date & time: 03/27/20  4235     History Chief Complaint  Patient presents with  . Headache  . Fever    Alexander Villanueva is a 24 y.o. male.  Patient states that he has been having some headaches along with some chills.  The history is provided by the patient. No language interpreter was used.  Headache Pain location:  Frontal Quality:  Dull Radiates to:  Does not radiate Severity currently:  3/10 Severity at highest:  6/10 Onset quality:  Sudden Timing:  Intermittent Progression:  Waxing and waning Associated symptoms: fever   Associated symptoms: no abdominal pain, no back pain, no congestion, no cough, no diarrhea, no fatigue, no seizures and no sinus pressure   Fever Associated symptoms: headaches   Associated symptoms: no chest pain, no congestion, no cough, no diarrhea and no rash        Past Medical History:  Diagnosis Date  . Asthma   . Concussion     There are no problems to display for this patient.   History reviewed. No pertinent surgical history.     No family history on file.  Social History   Tobacco Use  . Smoking status: Current Some Day Smoker    Packs/day: 0.50    Types: Cigarettes  . Smokeless tobacco: Never Used  Substance Use Topics  . Alcohol use: No  . Drug use: No    Home Medications Prior to Admission medications   Medication Sig Start Date End Date Taking? Authorizing Provider  albuterol (PROVENTIL HFA;VENTOLIN HFA) 108 (90 Base) MCG/ACT inhaler Inhale 1-2 puffs into the lungs every 4 (four) hours as needed for wheezing or shortness of breath. Patient not taking: Reported on 03/27/2020 01/13/16   Arthor Captain, PA-C  cyclobenzaprine (FLEXERIL) 5 MG tablet Take 1 tablet (5 mg total) by mouth 3 (three) times daily as needed (muscle soreness). Patient not taking: Reported on 03/27/2020 05/13/18   Devoria Albe, MD  doxycycline (VIBRAMYCIN) 100 MG capsule  Take 1 capsule (100 mg total) by mouth 2 (two) times daily. One po bid x 7 days 03/27/20   Bethann Berkshire, MD  naproxen (NAPROSYN) 500 MG tablet Take 1 po BID with food prn pain Patient not taking: Reported on 03/27/2020 05/13/18   Devoria Albe, MD    Allergies    Patient has no known allergies.  Review of Systems   Review of Systems  Constitutional: Positive for fever. Negative for appetite change and fatigue.  HENT: Negative for congestion, ear discharge and sinus pressure.   Eyes: Negative for discharge.  Respiratory: Negative for cough.   Cardiovascular: Negative for chest pain.  Gastrointestinal: Negative for abdominal pain and diarrhea.  Genitourinary: Negative for frequency and hematuria.  Musculoskeletal: Negative for back pain.  Skin: Negative for rash.  Neurological: Positive for headaches. Negative for seizures.  Psychiatric/Behavioral: Negative for hallucinations.    Physical Exam Updated Vital Signs BP 115/64 (BP Location: Right Arm)   Pulse 83   Temp 99.2 F (37.3 C) (Oral)   Resp 19   Ht 5\' 7"  (1.702 m)   Wt 129.3 kg   SpO2 98%   BMI 44.64 kg/m   Physical Exam Vitals reviewed.  Constitutional:      Appearance: He is well-developed.  HENT:     Head: Normocephalic.     Nose: Nose normal.  Eyes:     General: No scleral icterus.    Conjunctiva/sclera: Conjunctivae  normal.  Neck:     Thyroid: No thyromegaly.  Cardiovascular:     Rate and Rhythm: Normal rate and regular rhythm.     Heart sounds: No murmur heard.  No friction rub. No gallop.   Pulmonary:     Breath sounds: No stridor. No wheezing or rales.  Chest:     Chest wall: No tenderness.  Abdominal:     General: There is no distension.     Tenderness: There is no abdominal tenderness. There is no rebound.  Musculoskeletal:        General: Normal range of motion.     Cervical back: Neck supple.  Lymphadenopathy:     Cervical: No cervical adenopathy.  Skin:    Findings: No erythema or rash.    Neurological:     Mental Status: He is alert and oriented to person, place, and time.     Motor: No abnormal muscle tone.     Coordination: Coordination normal.  Psychiatric:        Behavior: Behavior normal.     ED Results / Procedures / Treatments   Labs (all labs ordered are listed, but only abnormal results are displayed) Labs Reviewed  CBC WITH DIFFERENTIAL/PLATELET - Abnormal; Notable for the following components:      Result Value   WBC 10.6 (*)    MCV 79.8 (*)    MCH 25.9 (*)    Lymphs Abs 4.6 (*)    All other components within normal limits  COMPREHENSIVE METABOLIC PANEL - Abnormal; Notable for the following components:   Sodium 134 (*)    Potassium 3.3 (*)    Calcium 8.6 (*)    ALT 62 (*)    All other components within normal limits  RESP PANEL BY RT-PCR (FLU A&B, COVID) ARPGX2  URINALYSIS, ROUTINE W REFLEX MICROSCOPIC    EKG None  Radiology DG Chest Portable 1 View  Result Date: 03/27/2020 CLINICAL DATA:  Chest pain. Additional provided: Patient reports headache, fever, chills, body aches, symptoms for 3 days, chest pain today, history of asthma. EXAM: PORTABLE CHEST 1 VIEW COMPARISON:  Prior chest radiograph 10/11/2017 and earlier. FINDINGS: Heart size within normal limits. No appreciable airspace consolidation. No evidence of pleural effusion or pneumothorax. No acute bony abnormality identified. IMPRESSION: No evidence of active cardiopulmonary disease. Electronically Signed   By: Jackey Loge DO   On: 03/27/2020 10:21    Procedures Procedures (including critical care time)  Medications Ordered in ED Medications  sodium chloride 0.9 % bolus 1,000 mL (0 mLs Intravenous Stopped 03/27/20 1309)  ketorolac (TORADOL) 30 MG/ML injection 30 mg (30 mg Intravenous Given 03/27/20 1123)    ED Course  I have reviewed the triage vital signs and the nursing notes.  Pertinent labs & imaging results that were available during my care of the patient were reviewed by me  and considered in my medical decision making (see chart for details).    MDM Rules/Calculators/A&P                          Labs unremarkable.  Patient will be empirically treated for [facility] spotted fever with doxycycline and follow-up with primary care doctor Final Clinical Impression(s) / ED Diagnoses Final diagnoses:  Viral illness    Rx / DC Orders ED Discharge Orders         Ordered    doxycycline (VIBRAMYCIN) 100 MG capsule  2 times daily  03/27/20 1502           Bethann Berkshire, MD 03/27/20 1506

## 2020-03-27 NOTE — ED Triage Notes (Signed)
Pt reports headache, fever, chills, body aches x 3 days.  Reports chest pain today.

## 2020-03-27 NOTE — ED Notes (Signed)
Pt up and ambulatory to restroom without assistance from staff. 

## 2020-03-27 NOTE — ED Notes (Addendum)
Entered room and introduced self to patient. AT this time, pt appears to be in no acute distress. Respirations are even and unlabored with equal chest rise and fall. Bed is locked in the lowest position, side rails x2, call bell within reach. Pt educated on hourly rounding and call light use, verbalized understanding and in agreement. All questions and concerns voiced addressed at this time.

## 2020-03-27 NOTE — Discharge Instructions (Addendum)
Follow up with dr. Karilyn Cota if not improving.   Take tylenol or motrin for headache

## 2020-03-28 ENCOUNTER — Telehealth (INDEPENDENT_AMBULATORY_CARE_PROVIDER_SITE_OTHER): Payer: Self-pay | Admitting: Nurse Practitioner

## 2020-03-28 LAB — ROCKY MTN SPOTTED FVR ABS PNL(IGG+IGM)
RMSF IgG: NEGATIVE
RMSF IgM: 0.24 index (ref 0.00–0.89)

## 2020-03-28 NOTE — Telephone Encounter (Signed)
This patient was seen in the emergency department and his ER note visit was sent to Dr. Karilyn Cota.  It does not appear the patient has a primary care provider in the system, however the emergency department physician recommended the patient follow-up with his primary care provider.  Would you call and offer to set up a new patient/ER follow-up appointment with this patient? It has been >3 years since he was seen by Dr. Karilyn Cota, so I believe that would warrant a new patient visit. I would recommend he be scheduled for next week so if only availability is with myself that would be fine.  If the patient has a primary care provider that is not listed in the system and does not want to come here that is fine as well, I just wanted to offer this to him based on recommendations from ER physician and the fact that the patient has been seen by Dr. Karilyn Cota in the past.

## 2020-04-01 ENCOUNTER — Other Ambulatory Visit: Payer: Self-pay

## 2020-04-01 ENCOUNTER — Telehealth (INDEPENDENT_AMBULATORY_CARE_PROVIDER_SITE_OTHER): Payer: Self-pay | Admitting: Nurse Practitioner

## 2020-04-01 ENCOUNTER — Encounter (HOSPITAL_COMMUNITY): Payer: Self-pay | Admitting: *Deleted

## 2020-04-01 DIAGNOSIS — Z20822 Contact with and (suspected) exposure to covid-19: Secondary | ICD-10-CM | POA: Diagnosis not present

## 2020-04-01 DIAGNOSIS — F17210 Nicotine dependence, cigarettes, uncomplicated: Secondary | ICD-10-CM | POA: Diagnosis not present

## 2020-04-01 DIAGNOSIS — R519 Headache, unspecified: Secondary | ICD-10-CM | POA: Insufficient documentation

## 2020-04-01 DIAGNOSIS — J45909 Unspecified asthma, uncomplicated: Secondary | ICD-10-CM | POA: Insufficient documentation

## 2020-04-01 DIAGNOSIS — R112 Nausea with vomiting, unspecified: Secondary | ICD-10-CM | POA: Insufficient documentation

## 2020-04-01 DIAGNOSIS — D72829 Elevated white blood cell count, unspecified: Secondary | ICD-10-CM | POA: Insufficient documentation

## 2020-04-01 DIAGNOSIS — D72820 Lymphocytosis (symptomatic): Secondary | ICD-10-CM | POA: Insufficient documentation

## 2020-04-01 DIAGNOSIS — J029 Acute pharyngitis, unspecified: Secondary | ICD-10-CM | POA: Diagnosis not present

## 2020-04-01 DIAGNOSIS — R509 Fever, unspecified: Secondary | ICD-10-CM | POA: Diagnosis present

## 2020-04-01 DIAGNOSIS — R161 Splenomegaly, not elsewhere classified: Secondary | ICD-10-CM | POA: Diagnosis not present

## 2020-04-01 MED ORDER — ACETAMINOPHEN 325 MG PO TABS
650.0000 mg | ORAL_TABLET | Freq: Once | ORAL | Status: AC
  Administered 2020-04-01: 650 mg via ORAL
  Filled 2020-04-01: qty 2

## 2020-04-01 NOTE — ED Triage Notes (Signed)
Pt with continued and increasing worse for his viral illness.  Pt states fever last night was 103.8.  Pt currently on antibiotics.  Fevers are worse. Emesis x 2 days.

## 2020-04-01 NOTE — Telephone Encounter (Signed)
This patient called today because he is concerned regarding fever.  He tells me that he has been feeling unwell for a little over a week and he was seen in the emergency department on 03/27/2020.  He was treated empirically with doxycycline for treatment of [facility] spotted fever.  He says he has been taking antibiotic, however his temperature continues to spike.  He tells me last night he got up to 104 F.  This morning tells me is 4 F.  He is having a hard time eating and drinking, and tells me he vomited this morning after taking his medication.  I recommended at this point he proceed back to the emergency department as I am very concerned that his temperature is at high and he is having a hard time keeping down food and liquids.  He tells me he understands will proceed to the ER this morning.

## 2020-04-02 ENCOUNTER — Emergency Department (HOSPITAL_COMMUNITY): Payer: Medicaid Other

## 2020-04-02 ENCOUNTER — Emergency Department (HOSPITAL_COMMUNITY)
Admission: EM | Admit: 2020-04-02 | Discharge: 2020-04-02 | Disposition: A | Discharge status: Home / Self Care | Payer: Medicaid Other | Attending: Emergency Medicine | Admitting: Emergency Medicine

## 2020-04-02 DIAGNOSIS — R161 Splenomegaly, not elsewhere classified: Secondary | ICD-10-CM

## 2020-04-02 DIAGNOSIS — R109 Unspecified abdominal pain: Secondary | ICD-10-CM

## 2020-04-02 DIAGNOSIS — R509 Fever, unspecified: Secondary | ICD-10-CM

## 2020-04-02 DIAGNOSIS — D72820 Lymphocytosis (symptomatic): Secondary | ICD-10-CM

## 2020-04-02 DIAGNOSIS — D72829 Elevated white blood cell count, unspecified: Secondary | ICD-10-CM

## 2020-04-02 DIAGNOSIS — R112 Nausea with vomiting, unspecified: Secondary | ICD-10-CM

## 2020-04-02 LAB — CBC WITH DIFFERENTIAL/PLATELET
Abs Immature Granulocytes: 0.09 10*3/uL — ABNORMAL HIGH (ref 0.00–0.07)
Basophils Absolute: 0.1 10*3/uL (ref 0.0–0.1)
Basophils Relative: 1 %
Eosinophils Absolute: 0 10*3/uL (ref 0.0–0.5)
Eosinophils Relative: 0 %
HCT: 45.8 % (ref 39.0–52.0)
Hemoglobin: 14.9 g/dL (ref 13.0–17.0)
Immature Granulocytes: 1 %
Lymphocytes Relative: 54 %
Lymphs Abs: 7.3 10*3/uL — ABNORMAL HIGH (ref 0.7–4.0)
MCH: 25.4 pg — ABNORMAL LOW (ref 26.0–34.0)
MCHC: 32.5 g/dL (ref 30.0–36.0)
MCV: 78 fL — ABNORMAL LOW (ref 80.0–100.0)
Monocytes Absolute: 0.9 10*3/uL (ref 0.1–1.0)
Monocytes Relative: 7 %
Neutro Abs: 4.9 10*3/uL (ref 1.7–7.7)
Neutrophils Relative %: 37 %
Platelets: 227 10*3/uL (ref 150–400)
RBC: 5.87 MIL/uL — ABNORMAL HIGH (ref 4.22–5.81)
RDW: 14.6 % (ref 11.5–15.5)
WBC: 13.3 10*3/uL — ABNORMAL HIGH (ref 4.0–10.5)
nRBC: 0 % (ref 0.0–0.2)

## 2020-04-02 LAB — RESPIRATORY PANEL BY PCR

## 2020-04-02 LAB — BASIC METABOLIC PANEL
Anion gap: 10 (ref 5–15)
BUN: 7 mg/dL (ref 6–20)
CO2: 25 mmol/L (ref 22–32)
Calcium: 8.5 mg/dL — ABNORMAL LOW (ref 8.9–10.3)
Chloride: 97 mmol/L — ABNORMAL LOW (ref 98–111)
Creatinine, Ser: 0.86 mg/dL (ref 0.61–1.24)
GFR, Estimated: >60 mL/min (ref >60)
Glucose, Bld: 138 mg/dL — ABNORMAL HIGH (ref 70–99)
Potassium: 3 mmol/L — ABNORMAL LOW (ref 3.5–5.1)
Sodium: 132 mmol/L — ABNORMAL LOW (ref 135–145)

## 2020-04-02 LAB — HEPATIC FUNCTION PANEL
ALT: 70 U/L — ABNORMAL HIGH (ref 0–44)
AST: 60 U/L — ABNORMAL HIGH (ref 15–41)
Albumin: 3.5 g/dL (ref 3.5–5.0)
Alkaline Phosphatase: 66 U/L (ref 38–126)
Bilirubin, Direct: 0.2 mg/dL (ref 0.0–0.2)
Indirect Bilirubin: 0.6 mg/dL (ref 0.3–0.9)
Total Bilirubin: 0.8 mg/dL (ref 0.3–1.2)
Total Protein: 7.3 g/dL (ref 6.5–8.1)

## 2020-04-02 LAB — HEPATITIS PANEL, ACUTE
HCV Ab: NONREACTIVE
Hep A IgM: NONREACTIVE
Hep B C IgM: NONREACTIVE
Hepatitis B Surface Ag: NONREACTIVE

## 2020-04-02 LAB — HIV ANTIBODY (ROUTINE TESTING W REFLEX): HIV Screen 4th Generation wRfx: NONREACTIVE

## 2020-04-02 LAB — LACTIC ACID, PLASMA
Lactic Acid, Venous: 3.2 mmol/L (ref 0.5–1.9)
Lactic Acid, Venous: 1.5 mmol/L (ref 0.5–1.9)

## 2020-04-02 LAB — RESP PANEL BY RT-PCR (FLU A&B, COVID) ARPGX2
Influenza A by PCR: NEGATIVE
Influenza B by PCR: NEGATIVE
SARS Coronavirus 2 by RT PCR: NEGATIVE

## 2020-04-02 LAB — MONONUCLEOSIS SCREEN: Mono Screen: NEGATIVE

## 2020-04-02 MED ORDER — IBUPROFEN 400 MG PO TABS
400.0000 mg | ORAL_TABLET | Freq: Once | ORAL | Status: AC
  Administered 2020-04-02: 400 mg via ORAL
  Filled 2020-04-02: qty 1

## 2020-04-02 MED ORDER — LACTATED RINGERS IV SOLN: INTRAVENOUS | Status: DC

## 2020-04-02 MED ORDER — SODIUM CHLORIDE 0.9 % IV SOLN
2.0000 g | Freq: Once | INTRAVENOUS | Status: AC
  Administered 2020-04-02: 2 g via INTRAVENOUS
  Filled 2020-04-02: qty 20

## 2020-04-02 MED ORDER — FENTANYL CITRATE (PF) 100 MCG/2ML IJ SOLN
50.0000 ug | Freq: Once | INTRAMUSCULAR | Status: AC
  Administered 2020-04-02: 50 ug via INTRAVENOUS
  Filled 2020-04-02: qty 2

## 2020-04-02 MED ORDER — ONDANSETRON 4 MG PO TBDP
4.0000 mg | ORAL_TABLET | Freq: Once | ORAL | Status: AC
  Administered 2020-04-02: 4 mg via ORAL
  Filled 2020-04-02: qty 1

## 2020-04-02 MED ORDER — IOHEXOL 300 MG/ML  SOLN
100.0000 mL | Freq: Once | INTRAMUSCULAR | Status: AC | PRN
  Administered 2020-04-02: 100 mL via INTRAVENOUS

## 2020-04-02 MED ORDER — PROMETHAZINE HCL 25 MG PO TABS: 25.0000 mg | ORAL_TABLET | Freq: Four times a day (QID) | ORAL | 0 refills | Status: DC | PRN

## 2020-04-02 MED ORDER — PROCHLORPERAZINE EDISYLATE 10 MG/2ML IJ SOLN
10.0000 mg | Freq: Once | INTRAMUSCULAR | Status: AC
  Administered 2020-04-02: 10 mg via INTRAVENOUS
  Filled 2020-04-02: qty 2

## 2020-04-02 MED ORDER — LACTATED RINGERS IV BOLUS
1000.0000 mL | Freq: Once | INTRAVENOUS | Status: AC
  Administered 2020-04-02: 1000 mL via INTRAVENOUS

## 2020-04-02 NOTE — ED Provider Notes (Signed)
[facility] EMERGENCY DEPARTMENT Provider Note   CSN: [id number] Arrival date & time: 04/01/20  1916     History Chief Complaint  Patient presents with  . Fever    Alexander Villanueva is a 24 y.o. male.   Fever Max temp prior to arrival:  103 Temp source:  Oral Severity:  Mild Onset quality:  Gradual Duration:  11 days Timing:  Constant Progression:  Waxing and waning Chronicity:  New Relieved by:  None tried Worsened by:  Nothing Ineffective treatments:  None tried Associated symptoms: cough, headaches, nausea, sore throat and vomiting   Associated symptoms: no chest pain, no diarrhea, no dysuria, no ear pain, no rash, no rhinorrhea and no somnolence   Risk factors: no contaminated food        Past Medical History:  Diagnosis Date  . Asthma   . Concussion     There are no problems to display for this patient.   History reviewed. No pertinent surgical history.     History reviewed. No pertinent family history.  Social History   Tobacco Use  . Smoking status: Current Some Day Smoker    Packs/day: 0.50    Types: Cigarettes  . Smokeless tobacco: Never Used  Substance Use Topics  . Alcohol use: No  . Drug use: No    Home Medications Prior to Admission medications   Medication Sig Start Date End Date Taking? Authorizing Provider  albuterol (PROVENTIL HFA;VENTOLIN HFA) 108 (90 Base) MCG/ACT inhaler Inhale 1-2 puffs into the lungs every 4 (four) hours as needed for wheezing or shortness of breath. Patient not taking: Reported on 03/27/2020 01/13/16   Arthor Captain, PA-C  cyclobenzaprine (FLEXERIL) 5 MG tablet Take 1 tablet (5 mg total) by mouth 3 (three) times daily as needed (muscle soreness). Patient not taking: Reported on 03/27/2020 05/13/18   Devoria Albe, MD  doxycycline (VIBRAMYCIN) 100 MG capsule Take 1 capsule (100 mg total) by mouth 2 (two) times daily. One po bid x 7 days 03/27/20   Bethann Berkshire, MD  naproxen (NAPROSYN) 500 MG tablet Take 1 po BID  with food prn pain Patient not taking: Reported on 03/27/2020 05/13/18   Devoria Albe, MD  promethazine (PHENERGAN) 25 MG tablet Take 1 tablet (25 mg total) by mouth every 6 (six) hours as needed for nausea or vomiting. 04/02/20   Cale Decarolis, Barbara Cower, MD    Allergies    Patient has no known allergies.  Review of Systems   Review of Systems  Constitutional: Positive for fever.  HENT: Positive for sore throat. Negative for ear pain and rhinorrhea.   Respiratory: Positive for cough.   Cardiovascular: Negative for chest pain.  Gastrointestinal: Positive for nausea and vomiting. Negative for diarrhea.  Genitourinary: Negative for dysuria.  Skin: Negative for rash.  Neurological: Positive for headaches.  All other systems reviewed and are negative.   Physical Exam Updated Vital Signs BP 110/64   Pulse 81   Temp 98.3 F (36.8 C) (Oral)   Resp 18   SpO2 98%   Physical Exam Vitals and nursing note reviewed.  Constitutional:      Appearance: He is well-developed.  HENT:     Head: Normocephalic and atraumatic.     Nose: No congestion or rhinorrhea.     Mouth/Throat:     Mouth: Mucous membranes are moist.     Pharynx: Oropharynx is clear.  Eyes:     Pupils: Pupils are equal, round, and reactive to light.  Cardiovascular:  Rate and Rhythm: Normal rate.  Pulmonary:     Effort: Pulmonary effort is normal. No respiratory distress.  Abdominal:     General: There is no distension.  Musculoskeletal:        General: No swelling or tenderness. Normal range of motion.     Cervical back: Normal range of motion.  Skin:    General: Skin is warm and dry.  Neurological:     General: No focal deficit present.     Mental Status: He is alert.     ED Results / Procedures / Treatments   Labs (all labs ordered are listed, but only abnormal results are displayed) Labs Reviewed  CBC WITH DIFFERENTIAL/PLATELET - Abnormal; Notable for the following components:      Result Value   WBC 13.3 (*)     RBC 5.87 (*)    MCV 78.0 (*)    MCH 25.4 (*)    Lymphs Abs 7.3 (*)    Abs Immature Granulocytes 0.09 (*)    All other components within normal limits  BASIC METABOLIC PANEL - Abnormal; Notable for the following components:   Sodium 132 (*)    Potassium 3.0 (*)    Chloride 97 (*)    Glucose, Bld 138 (*)    Calcium 8.5 (*)    All other components within normal limits  LACTIC ACID, PLASMA - Abnormal; Notable for the following components:   Lactic Acid, Venous 3.2 (*)    All other components within normal limits  HEPATIC FUNCTION PANEL - Abnormal; Notable for the following components:   AST 60 (*)    ALT 70 (*)    All other components within normal limits  RESP PANEL BY RT-PCR (FLU A&B, COVID) ARPGX2  CULTURE, BLOOD (SINGLE)  URINE CULTURE  RESPIRATORY PANEL BY PCR  LACTIC ACID, PLASMA  MONONUCLEOSIS SCREEN  URINALYSIS, ROUTINE W REFLEX MICROSCOPIC  HIV ANTIBODY (ROUTINE TESTING W REFLEX)  HEPATITIS PANEL, ACUTE    EKG None  Radiology CT Head Wo Contrast  Result Date: 04/02/2020 CLINICAL DATA:  Headache EXAM: CT HEAD WITHOUT CONTRAST TECHNIQUE: Contiguous axial images were obtained from the base of the skull through the vertex without intravenous contrast. COMPARISON:  None. FINDINGS: Brain: No evidence of acute territorial infarction, hemorrhage, hydrocephalus,extra-axial collection or mass lesion/mass effect. Normal gray-white differentiation. Ventricles are normal in size and contour. Vascular: No hyperdense vessel or unexpected calcification. Skull: The skull is intact. No fracture or focal lesion identified. Sinuses/Orbits: The visualized paranasal sinuses and mastoid air cells are clear. The orbits and globes intact. Other: None IMPRESSION: No acute intracranial abnormality. Electronically Signed   By: Jonna ClarkBindu  Avutu M.D.   On: 04/02/2020 03:44   CT CHEST ABDOMEN PELVIS W CONTRAST  Result Date: 04/02/2020 CLINICAL DATA:  Fever and vomiting. EXAM: CT CHEST, ABDOMEN, AND PELVIS  WITH CONTRAST TECHNIQUE: Multidetector CT imaging of the chest, abdomen and pelvis was performed following the standard protocol during bolus administration of intravenous contrast. CONTRAST:  100mL OMNIPAQUE IOHEXOL 300 MG/ML  SOLN COMPARISON:  CT scan 02/25/2015 FINDINGS: CT CHEST FINDINGS Cardiovascular: The heart is normal in size. No pericardial effusion. The aorta is normal in caliber. No aneurysm dissection. The branch vessels patent. No coronary artery calcifications. The pulmonary arteries are normal. Mediastinum/Nodes: No mediastinal or hilar mass or adenopathy. The esophagus is unremarkable. The thyroid gland is normal. Lungs/Pleura: No acute pulmonary findings. No infiltrates, edema or effusions. No pulmonary lesions. Musculoskeletal: No chest wall mass, supraclavicular or axillary adenopathy. The bony structures  are unremarkable. CT ABDOMEN PELVIS FINDINGS Hepatobiliary: No focal hepatic lesions or intrahepatic biliary dilatation. The gallbladder is normal. No common bile duct dilatation. Pancreas: No mass, inflammation ductal dilatation. Spleen: Moderate splenomegaly is noted. The spleen measures 14 x 13 x 11.5 cm. Splenic volume is 1000 cubic cm. No focal splenic lesions. Adrenals/Urinary Tract: The adrenal glands and kidneys are unremarkable. No renal lesions or renal calculi. No findings for pyelonephritis. The bladder is normal. Urachal remnant noted. Stomach/Bowel: The stomach, duodenum, small bowel and colon are unremarkable. No acute inflammatory changes, mass lesions or obstructive findings. The terminal ileum is normal. The appendix is normal. Vascular/Lymphatic: The aorta and branch vessels are normal. Major venous structures are patent. Small scattered mesenteric and retroperitoneal lymph nodes but no mass or overt adenopathy. Reproductive: The prostate gland and seminal vesicles are unremarkable. Other: No pelvic mass or adenopathy. No free pelvic fluid collections. No inguinal mass or  adenopathy. Small periumbilical abdominal wall hernia containing fat. Musculoskeletal: No significant bony findings. IMPRESSION: 1. No acute abdominal/pelvic findings, mass lesions or adenopathy. 2. No significant findings in the chest. No infiltrates or adenopathy. 3. Moderate splenomegaly. Electronically Signed   By: Rudie Meyer M.D.   On: 04/02/2020 06:40   DG Chest Portable 1 View  Result Date: 04/02/2020 CLINICAL DATA:  Worsening symptoms EXAM: PORTABLE CHEST 1 VIEW COMPARISON:  March 27, 2020 FINDINGS: The heart size and mediastinal contours are within normal limits. Both lungs are clear. The visualized skeletal structures are unremarkable. IMPRESSION: No active disease. Electronically Signed   By: Jonna Clark M.D.   On: 04/02/2020 03:33    Procedures Procedures (including critical care time)  Medications Ordered in ED Medications  lactated ringers infusion ( Intravenous Stopped 04/02/20 0708)  acetaminophen (TYLENOL) tablet 650 mg (650 mg Oral Given 04/01/20 2256)  cefTRIAXone (ROCEPHIN) 2 g in sodium chloride 0.9 % 100 mL IVPB (0 g Intravenous Stopped 04/02/20 0613)  lactated ringers bolus 1,000 mL (0 mLs Intravenous Stopped 04/02/20 0613)  ibuprofen (ADVIL) tablet 400 mg (400 mg Oral Given 04/02/20 0342)  ondansetron (ZOFRAN-ODT) disintegrating tablet 4 mg (4 mg Oral Given 04/02/20 0342)  fentaNYL (SUBLIMAZE) injection 50 mcg (50 mcg Intravenous Given 04/02/20 0347)  prochlorperazine (COMPAZINE) injection 10 mg (10 mg Intravenous Given 04/02/20 0343)  iohexol (OMNIPAQUE) 300 MG/ML solution 100 mL (100 mLs Intravenous Contrast Given 04/02/20 0612)    ED Course  I have reviewed the triage vital signs and the nursing notes.  Pertinent labs & imaging results that were available during my care of the patient were reviewed by me and considered in my medical decision making (see chart for details).  Clinical Course as of Apr 02 914  Tue Apr 02, 2020  0411 No obvious abnormalities  CT  Head Wo Contrast [JM]  0412 No obvious consolidations, however doxycycline would cover for CAP if that were the case.   DG Chest Portable 1 View [JM]  [phone] Getting fluids. Antibiotics already initiated. Cultures already ordered.   Lactic Acid, Venous(!!): 3.2 [JM]  0413 WBC(!): 13.3 [JM]    Clinical Course User Index [JM] Kathee Tumlin, Barbara Cower, MD   MDM Rules/Calculators/A&P                          Unclear etiology for symptoms. Considered meningitis but no nuchal rigidity, rash, well appearance speak against it. Shared decision making and deferred LP. Possibly mono vs early lymphoma vs atypical viral infection. Has pcp follow up in  a couple days for further workup.   Final Clinical Impression(s) / ED Diagnoses Final diagnoses:  Abdominal pain  Splenomegaly  Leukocytosis, unspecified type  Lymphocytosis  Fever, unspecified fever cause  Non-intractable vomiting with nausea, unspecified vomiting type    Rx / DC Orders ED Discharge Orders         Ordered    promethazine (PHENERGAN) 25 MG tablet  Every 6 hours PRN        04/02/20 0701           Artelia Game, Barbara Cower, MD 04/02/20 [phone]

## 2020-04-02 NOTE — ED Notes (Signed)
Date and time results received: 04/02/20 0350 (use smartphrase ".now" to insert current time)  Test: lactic acid Critical Value: 3.2  Name of Provider Notified: Dr Clayborne Dana  Orders Received? Or Actions Taken?: Actions Taken: no orders received

## 2020-04-02 NOTE — ED Notes (Signed)
Call to lab to verify that there is enough blood for testing that has been added on. Spoke with Cordelia Pen.

## 2020-04-02 NOTE — Progress Notes (Signed)
[facility]link following for sepsis protocol. 

## 2020-04-04 ENCOUNTER — Other Ambulatory Visit: Payer: Self-pay

## 2020-04-04 ENCOUNTER — Ambulatory Visit (INDEPENDENT_AMBULATORY_CARE_PROVIDER_SITE_OTHER): Payer: Medicaid Other | Admitting: Nurse Practitioner

## 2020-04-04 ENCOUNTER — Encounter (INDEPENDENT_AMBULATORY_CARE_PROVIDER_SITE_OTHER): Payer: Self-pay | Admitting: Nurse Practitioner

## 2020-04-04 VITALS — BP 116/70 | HR 116 | Temp 99.2°F | Ht 67.5 in | Wt 293.8 lb

## 2020-04-04 DIAGNOSIS — R509 Fever, unspecified: Secondary | ICD-10-CM

## 2020-04-04 LAB — POCT RAPID STREP A (OFFICE): Rapid Strep A Screen: NEGATIVE

## 2020-04-04 NOTE — Progress Notes (Signed)
Subjective:  Patient ID: Alexander Villanueva, male    DOB: Sep 11, 1995  Age: 24 y.o. MRN: [id number]  CC:  Chief Complaint  Patient presents with  . Establish Care    Not able to eat since the Saturday before 10-29-96 because he has no appetite, fever, chills, headache, when he eats it makes him feel sick, feels exhausted, not able to sleep      HPI  This patient arrives today for the above.  Symptoms initially started on his birthday when he was getting a tattoo.  Symptoms first started with headache and then 1 week later progressed to headache with fever, chills, body aches, anorexia, nausea, vomiting, abdominal pain, fatigue, and sore throat.  He has tells me he did have a mild episode of chest pain but was evaluated for this in the ER an EKG was fairly normal.  He has been seen in the ER twice.  First time was on 03/27/20 and then again on 04/02/2020.  Work-up thus far has been mostly negative.  Hepatitis testings have been negative, HIV was negative, mono screening was negative, COVID-19 test has been negative on 3 separate tests, respiratory panel is negative, testing for [facility] spotted fever was negative, UA in the hospital was normal, blood cultures were negative, he did have mild hyponatremia and hypokalemia.  Mild hyperglycemia and low chloride.  Calcium was mildly low.  Kidney function appears to be normal.  AST and ALT have been mildly elevated, with ALT greater than AST; normal bilirubin levels, normal albumin, normal alkaline phosphatase.  CBC has shown elevated white blood cells, no anemia, no thrombocytopenia, differential shows elevated lymphocytes.Marland Kitchen  Has had CT of the chest, abdomen, and pelvis.  Only abnormalities noted was splenomegaly and urachal remnant.  Today, the patient tells me that he still feels generally unwell, he continues to spike fevers at home.  Fevers have gone up to as high as 104.  He is taking Tylenol as needed.  Last dose was at 10 AM this morning.   He does feel tired and achy.  He continues to have no appetite.  He is not been exposed to any known sick contacts.  He hasn't traveled or been outside for any prolonged period of time.  He does have pets in the house but tells me that they don't go very far outside and they both are treated for prevention of fleas and ticks.  His new tattoo doesn't appear to be infected.  He is able to eat and drink.  He does report that his urine today appeared dark and seem to smell foul.  He denies any pain with urination.  He denies any hematuria.  Past Medical History:  Diagnosis Date  . Asthma   . Concussion       History reviewed. No pertinent family history.  Social History   Social History Narrative  . Not on file   Social History   Tobacco Use  . Smoking status: Current Some Day Smoker    Packs/day: 0.50    Types: Cigarettes  . Smokeless tobacco: Never Used  Substance Use Topics  . Alcohol use: Yes    Alcohol/week: 1.0 standard drink    Types: 1 Cans of beer per week    Comment: occasional     Current Meds  Medication Sig  . acetaminophen (TYLENOL) 500 MG tablet Take 500 mg by mouth as needed.  Marland Kitchen ibuprofen (ADVIL) 200 MG tablet Take 200 mg by mouth as needed.  ROS:  Review of Systems  Constitutional: Positive for chills, fever and malaise/fatigue.  HENT: Positive for congestion and sore throat.   Respiratory: Positive for cough.   Cardiovascular: Positive for chest pain (before going to hospital).  Gastrointestinal: Positive for abdominal pain, nausea (resolved ) and vomiting (resolved). Negative for blood in stool, diarrhea and melena.       (+) anorexia  Genitourinary: Negative for dysuria, flank pain, frequency and hematuria.       (+) dark brown  Musculoskeletal: Positive for back pain (low back pain) and myalgias.  Neurological: Positive for headaches. Negative for sensory change.     Objective:   Today's Vitals: BP 116/70   Pulse (!) 116   Temp 99.2 F (37.3  C) (Temporal)   Ht 5' 7.5" (1.715 m)   Wt 293 lb 12.8 oz (133.3 kg)   SpO2 97%   BMI 45.34 kg/m  Vitals with BMI 04/04/2020 04/02/2020 04/02/2020  Height 5' 7.5" - -  Weight 293 lbs 13 oz - -  BMI 40.98 - -  Systolic 119 147 829  Diastolic 70 64 68  Pulse 562 81 84     Physical Exam Vitals reviewed.  Constitutional:      Appearance: Normal appearance.  HENT:     Head: Normocephalic and atraumatic.  Cardiovascular:     Rate and Rhythm: Regular rhythm. Tachycardia present.  Pulmonary:     Effort: Pulmonary effort is normal.     Breath sounds: Normal breath sounds.  Chest:  Breasts:     Right: No supraclavicular adenopathy.     Left: No supraclavicular adenopathy.    Musculoskeletal:     Cervical back: Neck supple.  Lymphadenopathy:     Cervical: No cervical adenopathy.     Right cervical: No superficial, deep or posterior cervical adenopathy.    Left cervical: No superficial, deep or posterior cervical adenopathy.     Upper Body:     Right upper body: No supraclavicular adenopathy.     Left upper body: No supraclavicular adenopathy.  Skin:    General: Skin is warm and dry.  Neurological:     Mental Status: He is alert and oriented to person, place, and time.  Psychiatric:        Mood and Affect: Mood normal.        Behavior: Behavior normal.        Thought Content: Thought content normal.        Judgment: Judgment normal.       POC strep: Negative   Assessment and Plan   1. Fever of unknown origin      Plan: 1.  Etiology still quite unclear.  We'll repeat/order blood work as listed below.  He was encouraged to continue treating himself with supportive care and focus on drinking plenty of fluids and using Tylenol every 6-8 hours as needed to control his fever.  He was told that if his symptoms worsen especially if he starts experiencing severe vomiting, severe abdominal pain, and/or severe fevers that are not reduced with use of Tylenol that he should  proceed back to the emergency department over the weekend.  Will await blood work results and urine tests before making further recommendations.  I did discuss this case with Dr. Anastasio Champion (my supervising physician), and he is agreeable to the above plan.   Tests ordered Orders Placed This Encounter  Procedures  . Urinalysis with Culture Reflex  . CBC with Differential/Platelets  . CMP with eGFR([facility])  .  Sedimentation Rate  . C-reactive Protein  . Epstein-Barr virus VCA, IgM  . Immunofixation interpretive, urine  . ANA  . Rheumatoid Factor  . Pathologist smear review  . B. burgdorfi Antibody  . POC Rapid Strep A      No orders of the defined types were placed in this encounter.   Patient to follow-up next week for close monitoring.  Ailene Ards, NP

## 2020-04-04 NOTE — Addendum Note (Signed)
Addended by: Jiles Prows E on: 04/04/2020 05:50 PM   Modules accepted: Orders

## 2020-04-05 ENCOUNTER — Telehealth (INDEPENDENT_AMBULATORY_CARE_PROVIDER_SITE_OTHER): Payer: Self-pay | Admitting: Nurse Practitioner

## 2020-04-05 NOTE — Telephone Encounter (Signed)
I called this patient to notify him that the lab work that has resulted thus far indicates he most likely is experiencing viral mononucleosis.  I recommended that he continue to stay home, rest, drink plenty of fluids, and use Tylenol as needed for fever.  I did discuss this with Dr. Karilyn Cota as well and he is agreeable to this plan.  I also discussed with the patient risk of splenic rupture and recommended that he avoid any significant physical exertion over the next few weeks.  I also told him that if he starts to experience severe abdominal pain he should proceed to the emergency department to rule out splenic rupture.  He tells me he understands and plans on following up as scheduled next week.  He is encouraged to call the office on Monday if he has any questions.

## 2020-04-07 LAB — CULTURE, BLOOD (SINGLE): Culture: NO GROWTH

## 2020-04-08 LAB — COMPLETE METABOLIC PANEL WITH GFR
AG Ratio: 1.1 (calc) (ref 1.0–2.5)
ALT: 81 U/L — ABNORMAL HIGH (ref 9–46)
AST: 88 U/L — ABNORMAL HIGH (ref 10–40)
Albumin: 3.3 g/dL — ABNORMAL LOW (ref 3.6–5.1)
Alkaline phosphatase (APISO): 65 U/L (ref 36–130)
BUN: 8 mg/dL (ref 7–25)
CO2: 29 mmol/L (ref 20–32)
Calcium: 8.2 mg/dL — ABNORMAL LOW (ref 8.6–10.3)
Chloride: 97 mmol/L — ABNORMAL LOW (ref 98–110)
Creat: 0.72 mg/dL (ref 0.60–1.35)
GFR, Est African American: 152 mL/min/{1.73_m2} (ref >=60)
GFR, Est Non African American: 131 mL/min/{1.73_m2} (ref >=60)
Globulin: 3.1 g/dL (calc) (ref 1.9–3.7)
Glucose, Bld: 101 mg/dL (ref 65–139)
Potassium: 3.4 mmol/L — ABNORMAL LOW (ref 3.5–5.3)
Sodium: 134 mmol/L — ABNORMAL LOW (ref 135–146)
Total Bilirubin: 0.8 mg/dL (ref 0.2–1.2)
Total Protein: 6.4 g/dL (ref 6.1–8.1)

## 2020-04-08 LAB — URINALYSIS W MICROSCOPIC + REFLEX CULTURE
Bacteria, UA: NONE SEEN /HPF
Bilirubin Urine: NEGATIVE
Glucose, UA: NEGATIVE
Hgb urine dipstick: NEGATIVE
Hyaline Cast: NONE SEEN /LPF
Ketones, ur: NEGATIVE
Leukocyte Esterase: NEGATIVE
Nitrites, Initial: NEGATIVE
Protein, ur: NEGATIVE
RBC / HPF: NONE SEEN /HPF (ref 0–2)
Specific Gravity, Urine: 1.021 (ref 1.001–1.03)
Squamous Epithelial / HPF: NONE SEEN /HPF (ref <=5)
pH: 5.5 (ref 5.0–8.0)

## 2020-04-08 LAB — CBC WITH DIFFERENTIAL/PLATELET
Absolute Monocytes: 1093 cells/uL — ABNORMAL HIGH (ref 200–950)
Basophils Absolute: 156 cells/uL (ref 0–200)
Basophils Relative: 1.1 %
Eosinophils Absolute: 57 cells/uL (ref 15–500)
Eosinophils Relative: 0.4 %
HCT: 42.2 % (ref 38.5–50.0)
Hemoglobin: 14.2 g/dL (ref 13.2–17.1)
Lymphs Abs: 8477 cells/uL — ABNORMAL HIGH (ref 850–3900)
MCH: 25.5 pg — ABNORMAL LOW (ref 27.0–33.0)
MCHC: 33.6 g/dL (ref 32.0–36.0)
MCV: 75.9 fL — ABNORMAL LOW (ref 80.0–100.0)
MPV: 11.6 fL (ref 7.5–12.5)
Monocytes Relative: 7.7 %
Neutro Abs: 4416 cells/uL (ref 1500–7800)
Neutrophils Relative %: 31.1 %
Platelets: 223 10*3/uL (ref 140–400)
RBC: 5.56 10*6/uL (ref 4.20–5.80)
RDW: 14.8 % (ref 11.0–15.0)
Total Lymphocyte: 59.7 %
WBC: 14.2 10*3/uL — ABNORMAL HIGH (ref 3.8–10.8)

## 2020-04-08 LAB — PATHOLOGIST SMEAR REVIEW

## 2020-04-08 LAB — ANA: Anti Nuclear Antibody (ANA): NEGATIVE

## 2020-04-08 LAB — IMMUNOFIXATION INTE

## 2020-04-08 LAB — NO CULTURE INDICATED

## 2020-04-08 LAB — RHEUMATOID FACTOR: Rheumatoid fact SerPl-aCnc: <14 IU/mL (ref <14)

## 2020-04-08 LAB — C-REACTIVE PROTEIN: CRP: 45.6 mg/L — ABNORMAL HIGH (ref <8.0)

## 2020-04-08 LAB — B. BURGDORFI ANTIBODIES: B burgdorferi Ab IgG+IgM: <0.9 index

## 2020-04-08 LAB — SEDIMENTATION RATE: Sed Rate: 9 mm/h (ref 0–15)

## 2020-04-08 LAB — EPSTEIN-BARR VIRUS VCA, IGM: EBV VCA IgM: >160 U/mL — ABNORMAL HIGH

## 2020-04-10 ENCOUNTER — Ambulatory Visit (INDEPENDENT_AMBULATORY_CARE_PROVIDER_SITE_OTHER): Payer: Medicaid Other | Admitting: Internal Medicine

## 2020-04-10 ENCOUNTER — Encounter (INDEPENDENT_AMBULATORY_CARE_PROVIDER_SITE_OTHER): Payer: Self-pay | Admitting: Internal Medicine

## 2020-04-10 ENCOUNTER — Other Ambulatory Visit: Payer: Self-pay

## 2020-04-10 VITALS — BP 120/74 | HR 101 | Temp 97.0°F | Ht 67.5 in | Wt 295.2 lb

## 2020-04-10 DIAGNOSIS — D72829 Elevated white blood cell count, unspecified: Secondary | ICD-10-CM

## 2020-04-10 DIAGNOSIS — R748 Abnormal levels of other serum enzymes: Secondary | ICD-10-CM

## 2020-04-10 DIAGNOSIS — B2700 Gammaherpesviral mononucleosis without complication: Secondary | ICD-10-CM | POA: Diagnosis not present

## 2020-04-10 DIAGNOSIS — E876 Hypokalemia: Secondary | ICD-10-CM

## 2020-04-10 NOTE — Progress Notes (Signed)
Metrics: Intervention Frequency ACO  Documented Smoking Status Yearly  Screened one or more times in 24 months  Cessation Counseling or  Active cessation medication Past 24 months  Past 24 months   Guideline developer: UpToDate (See UpToDate for funding source) Date Released: 2014       Wellness Office Visit  Subjective:  Patient ID: Alexander Villanueva, male    DOB: 08-23-1995  Age: 24 y.o. MRN: [id number]  CC: This man comes in for follow-up regarding his most recent infectious illness.  He does feel better. HPI  He says he has no nausea, no fever anymore.  Most of his symptoms are resolving now.  The diagnosis was infectious mononucleosis based on IgM EBV titers that were significantly elevated.  All other blood tests were negative.  He did have leukocytosis, elevated liver enzymes which would be in keeping with this condition. Past Medical History:  Diagnosis Date  . Asthma   . Concussion    History reviewed. No pertinent surgical history.   History reviewed. No pertinent family history.  Social History   Social History Narrative  . Not on file   Social History   Tobacco Use  . Smoking status: Current Some Day Smoker    Packs/day: 0.50    Types: Cigarettes  . Smokeless tobacco: Never Used  Substance Use Topics  . Alcohol use: Yes    Alcohol/week: 1.0 standard drink    Types: 1 Cans of beer per week    Comment: occasional    Current Meds  Medication Sig  . acetaminophen (TYLENOL) 500 MG tablet Take 500 mg by mouth as needed.  Marland Kitchen ibuprofen (ADVIL) 200 MG tablet Take 200 mg by mouth as needed.      No flowsheet data found.   Objective:   Today's Vitals: BP 120/74   Pulse (!) 101   Temp (!) 97 F (36.1 C) (Temporal)   Ht 5' 7.5" (1.715 m)   Wt 295 lb 3.2 oz (133.9 kg)   SpO2 98%   BMI 45.55 kg/m  Vitals with BMI 04/10/2020 04/04/2020 04/02/2020  Height 5' 7.5" 5' 7.5" -  Weight 295 lbs 3 oz 293 lbs 13 oz -  BMI 45.53 45.31 -  Systolic 120 116 440   Diastolic 74 70 64  Pulse 101 116 81     Physical Exam  He looks well, he does not look septic or toxic.     Assessment   1. Gammaherpesviral mononucleosis without complication   2. Elevated liver enzymes   3. Hypokalemia   4. Leukocytosis, unspecified type       Tests ordered Orders Placed This Encounter  Procedures  . CBC  . COMPLETE METABOLIC PANEL WITH GFR  . C-reactive protein     Plan: 1. We discussed his diagnosis and expect full resolution.  I will check blood work to make sure all parameters are improving. 2. I recommended COVID-19 vaccination and discussed the importance of vaccination. 3. He will follow-up with Maralyn Sago in 3 months for an annual physical exam.   No orders of the defined types were placed in this encounter.   Wilson Singer, MD

## 2020-04-11 ENCOUNTER — Other Ambulatory Visit (INDEPENDENT_AMBULATORY_CARE_PROVIDER_SITE_OTHER): Payer: Self-pay | Admitting: Internal Medicine

## 2020-04-11 LAB — COMPLETE METABOLIC PANEL WITH GFR
AG Ratio: 1 (calc) (ref 1.0–2.5)
ALT: 86 U/L — ABNORMAL HIGH (ref 9–46)
AST: 85 U/L — ABNORMAL HIGH (ref 10–40)
Albumin: 2.8 g/dL — ABNORMAL LOW (ref 3.6–5.1)
Alkaline phosphatase (APISO): 61 U/L (ref 36–130)
BUN: 7 mg/dL (ref 7–25)
CO2: 29 mmol/L (ref 20–32)
Calcium: 8.2 mg/dL — ABNORMAL LOW (ref 8.6–10.3)
Chloride: 101 mmol/L (ref 98–110)
Creat: 0.7 mg/dL (ref 0.60–1.35)
GFR, Est African American: 154 mL/min/{1.73_m2} (ref >=60)
GFR, Est Non African American: 133 mL/min/{1.73_m2} (ref >=60)
Globulin: 2.9 g/dL (calc) (ref 1.9–3.7)
Glucose, Bld: 100 mg/dL (ref 65–139)
Potassium: 3.3 mmol/L — ABNORMAL LOW (ref 3.5–5.3)
Sodium: 139 mmol/L (ref 135–146)
Total Bilirubin: 0.6 mg/dL (ref 0.2–1.2)
Total Protein: 5.7 g/dL — ABNORMAL LOW (ref 6.1–8.1)

## 2020-04-11 LAB — CBC
HCT: 41.2 % (ref 38.5–50.0)
Hemoglobin: 13.6 g/dL (ref 13.2–17.1)
MCH: 25.6 pg — ABNORMAL LOW (ref 27.0–33.0)
MCHC: 33 g/dL (ref 32.0–36.0)
MCV: 77.4 fL — ABNORMAL LOW (ref 80.0–100.0)
MPV: 11.7 fL (ref 7.5–12.5)
Platelets: 253 10*3/uL (ref 140–400)
RBC: 5.32 10*6/uL (ref 4.20–5.80)
RDW: 15.1 % — ABNORMAL HIGH (ref 11.0–15.0)
WBC: 15.1 10*3/uL — ABNORMAL HIGH (ref 3.8–10.8)

## 2020-04-11 LAB — C-REACTIVE PROTEIN: CRP: 14.7 mg/L — ABNORMAL HIGH (ref <8.0)

## 2020-04-11 MED ORDER — POTASSIUM CHLORIDE CRYS ER 20 MEQ PO TBCR: 40.0000 meq | EXTENDED_RELEASE_TABLET | Freq: Two times a day (BID) | ORAL | 0 refills | Status: DC

## 2020-05-29 ENCOUNTER — Other Ambulatory Visit: Payer: Self-pay

## 2020-05-29 ENCOUNTER — Emergency Department (HOSPITAL_COMMUNITY): Payer: Medicaid Other

## 2020-05-29 ENCOUNTER — Emergency Department (HOSPITAL_COMMUNITY)
Admission: EM | Admit: 2020-05-29 | Discharge: 2020-05-29 | Disposition: A | Discharge status: Home / Self Care | Payer: Medicaid Other | Attending: Emergency Medicine | Admitting: Emergency Medicine

## 2020-05-29 ENCOUNTER — Encounter (HOSPITAL_COMMUNITY): Payer: Self-pay

## 2020-05-29 DIAGNOSIS — F17210 Nicotine dependence, cigarettes, uncomplicated: Secondary | ICD-10-CM | POA: Diagnosis not present

## 2020-05-29 DIAGNOSIS — Y93H2 Activity, gardening and landscaping: Secondary | ICD-10-CM | POA: Insufficient documentation

## 2020-05-29 DIAGNOSIS — S80811A Abrasion, right lower leg, initial encounter: Secondary | ICD-10-CM | POA: Diagnosis not present

## 2020-05-29 DIAGNOSIS — W228XXA Striking against or struck by other objects, initial encounter: Secondary | ICD-10-CM | POA: Insufficient documentation

## 2020-05-29 DIAGNOSIS — J45909 Unspecified asthma, uncomplicated: Secondary | ICD-10-CM | POA: Diagnosis not present

## 2020-05-29 DIAGNOSIS — S8991XA Unspecified injury of right lower leg, initial encounter: Secondary | ICD-10-CM | POA: Diagnosis present

## 2020-05-29 NOTE — ED Triage Notes (Signed)
Pt to er, pt states that he was mowing and a softball sized rock got flung up and hit him in the leg, pt has abrasions to his R shin. Pt ambulatory with a limp. Pt states that he took 4 alieve for the pain.

## 2020-05-29 NOTE — Discharge Instructions (Addendum)
Elevate your leg when possible and apply ice packs on and off to help reduce swelling.  Ibuprofen or Aleve if needed for pain.  Follow-up with your primary doctor for recheck, return emergency department if develop any worsening symptoms.

## 2020-05-29 NOTE — ED Provider Notes (Signed)
[facility] EMERGENCY DEPARTMENT Provider Note   CSN: [id number] Arrival date & time: 05/29/20  1407     History Chief Complaint  Patient presents with  . Leg Injury    Alexander Villanueva is a 25 y.o. male.  HPI      Alexander Villanueva is a 25 y.o. male who presents to the Emergency Department complaining of pain and swelling of his right lower leg.  States that he was mowing a field when a large rock was struck by the mowing blade and it struck him in the lower leg.  Incident occurred 1 hour prior to arrival.  He describes aching pain along his right shin.  Pain is worse with weightbearing and with plantar flexion.  He took Aleve prior to arrival.  He denies numbness of his extremity, excessive bleeding, possible foreign body or pain to the posterior leg.  Last Td reported as up-to-date.   Past Medical History:  Diagnosis Date  . Asthma   . Concussion     There are no problems to display for this patient.   History reviewed. No pertinent surgical history.     History reviewed. No pertinent family history.  Social History   Tobacco Use  . Smoking status: Current Some Day Smoker    Packs/day: 0.50    Types: Cigarettes  . Smokeless tobacco: Never Used  Vaping Use  . Vaping Use: Former  Substance Use Topics  . Alcohol use: Yes    Alcohol/week: 1.0 standard drink    Types: 1 Cans of beer per week    Comment: occasional  . Drug use: No    Home Medications Prior to Admission medications   Medication Sig Start Date End Date Taking? Authorizing Provider  acetaminophen (TYLENOL) 500 MG tablet Take 500 mg by mouth as needed.    [provider]  ibuprofen (ADVIL) 200 MG tablet Take 200 mg by mouth as needed.    [provider]  potassium chloride SA (KLOR-CON) 20 MEQ tablet Take 2 tablets (40 mEq total) by mouth 2 (two) times daily. 04/11/20   Wilson Singer, MD    Allergies    Patient has no known allergies.  Review of Systems   Review of  Systems  Constitutional: Negative for chills and fever.  Respiratory: Negative for shortness of breath.   Cardiovascular: Negative for chest pain.  Gastrointestinal: Negative for nausea and vomiting.  Musculoskeletal: Positive for myalgias (right lower leg pain).  Skin: Positive for wound (abrasions). Negative for color change.  Neurological: Negative for dizziness, weakness, numbness and headaches.    Physical Exam Updated Vital Signs BP 128/80 (BP Location: Right Arm)   Pulse 81   Temp 98.3 F (36.8 C) (Oral)   Resp 18   Ht 5\' 7"  (1.702 m)   Wt 129.3 kg   SpO2 100%   BMI 44.64 kg/m   Physical Exam Vitals and nursing note reviewed.  Constitutional:      General: He is not in acute distress.    Appearance: Normal appearance.  Cardiovascular:     Rate and Rhythm: Normal rate and regular rhythm.     Pulses: Normal pulses.  Pulmonary:     Effort: Pulmonary effort is normal.  Musculoskeletal:        General: Tenderness and signs of injury present. Normal range of motion.     Comments: Small hematoma to the anterior right lower leg.  Several small abrasions also present.  No foreign bodies seen.  Wound  appears clean.  No posterior calf tenderness  Skin:    General: Skin is warm.     Capillary Refill: Capillary refill takes less than 2 seconds.  Neurological:     General: No focal deficit present.     Mental Status: He is alert.     Sensory: No sensory deficit.     Motor: No weakness.     ED Results / Procedures / Treatments   Labs (all labs ordered are listed, but only abnormal results are displayed) Labs Reviewed - No data to display  EKG None  Radiology DG Tibia/Fibula Right  Result Date: 05/29/2020 CLINICAL DATA:  Right leg injury. EXAM: RIGHT TIBIA AND FIBULA - 2 VIEW COMPARISON:  None. FINDINGS: There is no evidence of fracture or other focal bone lesions. Soft tissues are unremarkable. IMPRESSION: Negative. Electronically Signed   By: Lupita Raider M.D.    On: 05/29/2020 16:06    Procedures Procedures   Medications Ordered in ED Medications - No data to display  ED Course  I have reviewed the triage vital signs and the nursing notes.  Pertinent labs & imaging results that were available during my care of the patient were reviewed by me and considered in my medical decision making (see chart for details).    MDM Rules/Calculators/A&P                          Patient here for evaluation of direct blow to the anterior right lower leg.  Patient ambulatory.  Neurovascular intact.  Several abrasions to the anterior lower leg, wounds examined, no foreign body seen.  X-ray negative for fracture or evidence of FB's, wounds cleaned and bandaged.  No lacerations or indication for repair.  Td up-to-date.  Pt agrees to RICE therapy, wound care instructions discussed, NSAID. Return precautions given  Final Clinical Impression(s) / ED Diagnoses Final diagnoses:  Abrasion of anterior right lower leg, initial encounter    Rx / DC Orders ED Discharge Orders    None       Pauline Aus, PA-C 05/29/20 1621    Derwood Kaplan, MD 06/03/20 1521

## 2020-06-13 ENCOUNTER — Encounter (HOSPITAL_COMMUNITY): Payer: Self-pay

## 2020-06-13 ENCOUNTER — Emergency Department (HOSPITAL_COMMUNITY): Payer: Medicaid Other

## 2020-06-13 ENCOUNTER — Other Ambulatory Visit: Payer: Self-pay

## 2020-06-13 ENCOUNTER — Emergency Department (HOSPITAL_COMMUNITY)
Admission: EM | Admit: 2020-06-13 | Discharge: 2020-06-13 | Disposition: A | Discharge status: Home / Self Care | Payer: Medicaid Other | Attending: Emergency Medicine | Admitting: Emergency Medicine

## 2020-06-13 DIAGNOSIS — J45909 Unspecified asthma, uncomplicated: Secondary | ICD-10-CM | POA: Insufficient documentation

## 2020-06-13 DIAGNOSIS — F17210 Nicotine dependence, cigarettes, uncomplicated: Secondary | ICD-10-CM | POA: Diagnosis not present

## 2020-06-13 DIAGNOSIS — R1013 Epigastric pain: Secondary | ICD-10-CM | POA: Diagnosis not present

## 2020-06-13 DIAGNOSIS — R1011 Right upper quadrant pain: Secondary | ICD-10-CM | POA: Insufficient documentation

## 2020-06-13 LAB — CBC WITH DIFFERENTIAL/PLATELET
Abs Immature Granulocytes: 0.03 10*3/uL (ref 0.00–0.07)
Basophils Absolute: 0.1 10*3/uL (ref 0.0–0.1)
Basophils Relative: 1 %
Eosinophils Absolute: 0.2 10*3/uL (ref 0.0–0.5)
Eosinophils Relative: 2 %
HCT: 42.7 % (ref 39.0–52.0)
Hemoglobin: 13.8 g/dL (ref 13.0–17.0)
Immature Granulocytes: 0 %
Lymphocytes Relative: 37 %
Lymphs Abs: 3.9 10*3/uL (ref 0.7–4.0)
MCH: 26.2 pg (ref 26.0–34.0)
MCHC: 32.3 g/dL (ref 30.0–36.0)
MCV: 81.2 fL (ref 80.0–100.0)
Monocytes Absolute: 0.9 10*3/uL (ref 0.1–1.0)
Monocytes Relative: 8 %
Neutro Abs: 5.5 10*3/uL (ref 1.7–7.7)
Neutrophils Relative %: 52 %
Platelets: 319 10*3/uL (ref 150–400)
RBC: 5.26 MIL/uL (ref 4.22–5.81)
RDW: 15.5 % (ref 11.5–15.5)
WBC: 10.6 10*3/uL — ABNORMAL HIGH (ref 4.0–10.5)
nRBC: 0 % (ref 0.0–0.2)

## 2020-06-13 LAB — COMPREHENSIVE METABOLIC PANEL
ALT: 32 U/L (ref 0–44)
AST: 24 U/L (ref 15–41)
Albumin: 3.9 g/dL (ref 3.5–5.0)
Alkaline Phosphatase: 53 U/L (ref 38–126)
Anion gap: 7 (ref 5–15)
BUN: 7 mg/dL (ref 6–20)
CO2: 25 mmol/L (ref 22–32)
Calcium: 8.8 mg/dL — ABNORMAL LOW (ref 8.9–10.3)
Chloride: 105 mmol/L (ref 98–111)
Creatinine, Ser: 0.64 mg/dL (ref 0.61–1.24)
GFR, Estimated: >60 mL/min (ref >60)
Glucose, Bld: 79 mg/dL (ref 70–99)
Potassium: 3.6 mmol/L (ref 3.5–5.1)
Sodium: 137 mmol/L (ref 135–145)
Total Bilirubin: 0.9 mg/dL (ref 0.3–1.2)
Total Protein: 7.4 g/dL (ref 6.5–8.1)

## 2020-06-13 LAB — URINALYSIS, ROUTINE W REFLEX MICROSCOPIC
Bilirubin Urine: NEGATIVE
Glucose, UA: NEGATIVE mg/dL
Hgb urine dipstick: NEGATIVE
Ketones, ur: NEGATIVE mg/dL
Leukocytes,Ua: NEGATIVE
Nitrite: NEGATIVE
Protein, ur: NEGATIVE mg/dL
Specific Gravity, Urine: 1.021 (ref 1.005–1.030)
pH: 5 (ref 5.0–8.0)

## 2020-06-13 LAB — LIPASE, BLOOD: Lipase: 37 U/L (ref 11–51)

## 2020-06-13 MED ORDER — SODIUM CHLORIDE 0.9 % IV BOLUS
1000.0000 mL | Freq: Once | INTRAVENOUS | Status: AC
  Administered 2020-06-13: 1000 mL via INTRAVENOUS

## 2020-06-13 MED ORDER — TRAMADOL HCL 50 MG PO TABS
50.0000 mg | ORAL_TABLET | Freq: Once | ORAL | Status: AC
Start: 2020-06-13 — End: 2020-06-13
  Administered 2020-06-13: 50 mg via ORAL
  Filled 2020-06-13: qty 1

## 2020-06-13 MED ORDER — KETOROLAC TROMETHAMINE 30 MG/ML IJ SOLN
30.0000 mg | Freq: Once | INTRAMUSCULAR | Status: AC
  Administered 2020-06-13: 30 mg via INTRAVENOUS
  Filled 2020-06-13: qty 1

## 2020-06-13 MED ORDER — ONDANSETRON HCL 4 MG/2ML IJ SOLN
4.0000 mg | Freq: Once | INTRAMUSCULAR | Status: AC
  Administered 2020-06-13: 4 mg via INTRAVENOUS
  Filled 2020-06-13: qty 2

## 2020-06-13 MED ORDER — TRAMADOL HCL 50 MG PO TABS
50.0000 mg | ORAL_TABLET | Freq: Four times a day (QID) | ORAL | 0 refills | Status: DC | PRN
Start: 2020-06-13 — End: 2021-01-02

## 2020-06-13 NOTE — Discharge Instructions (Addendum)
Take tramadol as prescribed as needed for pain.  Follow-up with your primary doctor if symptoms or not improving in the next 3 days, and return to the ER if symptoms significantly worsen or change.

## 2020-06-13 NOTE — ED Triage Notes (Addendum)
Epigastric/ Abdominal pain x1 hour. Says he feels nauseated. Did not take any medications prior to arrival

## 2020-06-13 NOTE — ED Provider Notes (Signed)
[facility] EMERGENCY DEPARTMENT Provider Note   CSN: [id number] Arrival date & time: 06/13/20  0249     History Chief Complaint  Patient presents with  . Abdominal Pain    Alexander Villanueva is a 25 y.o. male.  Patient is a 25 year old male with history of of asthma.  He presents today for evaluation of right-sided abdominal pain.  Patient states he woke up at 1 AM with significant discomfort to his epigastric region, right upper quadrant, and right lateral abdomen.  Pain is constant.  He feels nauseated, but has not vomited.  He denies fevers or chills.  He denies any recent bowel or bladder issues.  Denies prior abdominal surgery.  The history is provided by the patient.  Abdominal Pain Pain location:  Epigastric, RUQ and R flank Pain quality: stabbing   Pain radiates to:  Does not radiate Pain severity:  Severe Onset quality:  Sudden Duration:  2 hours Timing:  Constant Progression:  Unchanged Chronicity:  New Relieved by:  Nothing Worsened by:  Movement and palpation Ineffective treatments:  None tried Associated symptoms: no chills, no constipation, no diarrhea and no fever        Past Medical History:  Diagnosis Date  . Asthma   . Concussion     There are no problems to display for this patient.   History reviewed. No pertinent surgical history.     History reviewed. No pertinent family history.  Social History   Tobacco Use  . Smoking status: Current Some Day Smoker    Packs/day: 0.50    Types: Cigarettes  . Smokeless tobacco: Never Used  Vaping Use  . Vaping Use: Former  Substance Use Topics  . Alcohol use: Yes    Alcohol/week: 1.0 standard drink    Types: 1 Cans of beer per week    Comment: occasional  . Drug use: No    Home Medications Prior to Admission medications   Medication Sig Start Date End Date Taking? Authorizing Provider  acetaminophen (TYLENOL) 500 MG tablet Take 500 mg by mouth as needed.    [provider]   ibuprofen (ADVIL) 200 MG tablet Take 200 mg by mouth as needed.    [provider]  potassium chloride SA (KLOR-CON) 20 MEQ tablet Take 2 tablets (40 mEq total) by mouth 2 (two) times daily. 04/11/20   Wilson Singer, MD    Allergies    Patient has no known allergies.  Review of Systems   Review of Systems  Constitutional: Negative for chills and fever.  Gastrointestinal: Positive for abdominal pain. Negative for constipation and diarrhea.  All other systems reviewed and are negative.   Physical Exam Updated Vital Signs BP 108/65 (BP Location: Left Arm)   Pulse 74   Temp 97.7 F (36.5 C) (Oral)   Resp 20   Ht 5\' 7"  (1.702 m)   Wt 128.4 kg   SpO2 100%   BMI 44.32 kg/m   Physical Exam Vitals and nursing note reviewed.  Constitutional:      General: He is not in acute distress.    Appearance: He is well-developed and well-nourished. He is not diaphoretic.  HENT:     Head: Normocephalic and atraumatic.     Mouth/Throat:     Mouth: Oropharynx is clear and moist.  Cardiovascular:     Rate and Rhythm: Normal rate and regular rhythm.     Heart sounds: No murmur heard. No friction rub.  Pulmonary:     Effort:  Pulmonary effort is normal. No respiratory distress.     Breath sounds: Normal breath sounds. No wheezing or rales.  Abdominal:     General: Bowel sounds are normal. There is no distension.     Palpations: Abdomen is soft.     Tenderness: There is abdominal tenderness in the right upper quadrant and epigastric area. There is right CVA tenderness. There is no left CVA tenderness.  Musculoskeletal:        General: No edema. Normal range of motion.     Cervical back: Normal range of motion and neck supple.  Skin:    General: Skin is warm and dry.  Neurological:     Mental Status: He is alert and oriented to person, place, and time.     Coordination: Coordination normal.     ED Results / Procedures / Treatments   Labs (all labs ordered are listed, but  only abnormal results are displayed) Labs Reviewed - No data to display  EKG EKG Interpretation  Date/Time:  Thursday June 13 2020 03:18:07 EST Ventricular Rate:  79 PR Interval:    QRS Duration: 105 QT Interval:  369 QTC Calculation: 423 R Axis:   60 Text Interpretation: Sinus rhythm Normal ECG Confirmed by Geoffery Lyons ([id number]) on 06/13/2020 3:46:16 AM   Radiology No results found.  Procedures Procedures   Medications Ordered in ED Medications  sodium chloride 0.9 % bolus 1,000 mL (has no administration in time range)  ketorolac (TORADOL) 30 MG/ML injection 30 mg (has no administration in time range)  ondansetron (ZOFRAN) injection 4 mg (has no administration in time range)    ED Course  I have reviewed the triage vital signs and the nursing notes.  Pertinent labs & imaging results that were available during my care of the patient were reviewed by me and considered in my medical decision making (see chart for details).    MDM Rules/Calculators/A&P  Patient presenting here with complaints of pain in his epigastric region and right upper quadrant that started abruptly this morning.  Patient's laboratory studies are reassuring and he is afebrile.  CT scan shows no evidence for renal calculus or gallstone.  I am uncertain as to the etiology of his discomfort, but he seems to be feeling better after Toradol.  At this point, I feel as though discharge is appropriate.  Patient to be prescribed tramadol he can take as needed for pain and follow-up with primary doctor if not improving/return if he worsens.  Final Clinical Impression(s) / ED Diagnoses Final diagnoses:  None    Rx / DC Orders ED Discharge Orders    None       Geoffery Lyons, MD 06/13/20 [phone]

## 2020-06-13 NOTE — ED Notes (Signed)
ED Provider at bedside. 

## 2020-06-14 ENCOUNTER — Telehealth: Payer: Self-pay

## 2020-06-14 NOTE — Telephone Encounter (Signed)
Transition Care Management Follow-up Telephone Call  Date of discharge and from where: 06/13/2020  How have you been since you were released from the hospital? Pt stated that he is feeling a lot better and has not had any flare up of epigastric pain since he has been home.     Any questions or concerns? No  Items Reviewed:  Did the pt receive and understand the discharge instructions provided? Yes   Medications obtained and verified? Yes   Other? No   Any new allergies since your discharge? No   Dietary orders reviewed? N/A  Do you have support at home? Yes   Functional Questionnaire: (I = Independent and D = Dependent) ADLs: I  Bathing/Dressing- I  Meal Prep- I  Eating- I  Maintaining continence- I  Transferring/Ambulation- I  Managing Meds- I  Follow up appointments reviewed:   PCP Hospital f/u appt confirmed? No  Pt encourage to call pcp and schedule a hospital follow up.   Are transportation arrangements needed? No  If their condition worsens, is the pt aware to call PCP or go to the Emergency Dept.? Yes Was the patient provided with contact information for the PCP's office or ED? Yes Was to pt encouraged to call back with questions or concerns? Yes

## 2020-08-19 NOTE — Telephone Encounter (Signed)
Error, please disregard.

## 2020-10-09 ENCOUNTER — Encounter (INDEPENDENT_AMBULATORY_CARE_PROVIDER_SITE_OTHER): Payer: Self-pay | Admitting: Nurse Practitioner

## 2020-12-30 ENCOUNTER — Encounter (HOSPITAL_COMMUNITY): Payer: Self-pay

## 2020-12-30 ENCOUNTER — Emergency Department (HOSPITAL_COMMUNITY): Payer: Medicaid Other

## 2020-12-30 ENCOUNTER — Other Ambulatory Visit: Payer: Self-pay

## 2020-12-30 ENCOUNTER — Inpatient Hospital Stay (HOSPITAL_COMMUNITY)
Admission: EM | Admit: 2020-12-30 | Discharge: 2021-01-02 | DRG: 419 | Disposition: A | Discharge status: Home / Self Care | Payer: Medicaid Other | Attending: General Surgery | Admitting: General Surgery

## 2020-12-30 DIAGNOSIS — Z8249 Family history of ischemic heart disease and other diseases of the circulatory system: Secondary | ICD-10-CM

## 2020-12-30 DIAGNOSIS — D72829 Elevated white blood cell count, unspecified: Secondary | ICD-10-CM | POA: Diagnosis present

## 2020-12-30 DIAGNOSIS — Z28310 Unvaccinated for covid-19: Secondary | ICD-10-CM

## 2020-12-30 DIAGNOSIS — R748 Abnormal levels of other serum enzymes: Secondary | ICD-10-CM | POA: Diagnosis present

## 2020-12-30 DIAGNOSIS — F17210 Nicotine dependence, cigarettes, uncomplicated: Secondary | ICD-10-CM | POA: Diagnosis present

## 2020-12-30 DIAGNOSIS — Z20822 Contact with and (suspected) exposure to covid-19: Secondary | ICD-10-CM | POA: Diagnosis present

## 2020-12-30 DIAGNOSIS — K819 Cholecystitis, unspecified: Secondary | ICD-10-CM | POA: Diagnosis present

## 2020-12-30 DIAGNOSIS — K8010 Calculus of gallbladder with chronic cholecystitis without obstruction: Principal | ICD-10-CM | POA: Diagnosis present

## 2020-12-30 DIAGNOSIS — K8081 Other cholelithiasis with obstruction: Secondary | ICD-10-CM

## 2020-12-30 DIAGNOSIS — K8020 Calculus of gallbladder without cholecystitis without obstruction: Secondary | ICD-10-CM

## 2020-12-30 DIAGNOSIS — R7401 Elevation of levels of liver transaminase levels: Secondary | ICD-10-CM | POA: Diagnosis present

## 2020-12-30 DIAGNOSIS — Z419 Encounter for procedure for purposes other than remedying health state, unspecified: Secondary | ICD-10-CM

## 2020-12-30 LAB — CBC
HCT: 44.1 % (ref 39.0–52.0)
Hemoglobin: 14.7 g/dL (ref 13.0–17.0)
MCH: 27.2 pg (ref 26.0–34.0)
MCHC: 33.3 g/dL (ref 30.0–36.0)
MCV: 81.7 fL (ref 80.0–100.0)
Platelets: 323 10*3/uL (ref 150–400)
RBC: 5.4 MIL/uL (ref 4.22–5.81)
RDW: 14.3 % (ref 11.5–15.5)
WBC: 12.6 10*3/uL — ABNORMAL HIGH (ref 4.0–10.5)
nRBC: 0 % (ref 0.0–0.2)

## 2020-12-30 LAB — COMPREHENSIVE METABOLIC PANEL
ALT: 240 U/L — ABNORMAL HIGH (ref 0–44)
AST: 248 U/L — ABNORMAL HIGH (ref 15–41)
Albumin: 4.1 g/dL (ref 3.5–5.0)
Alkaline Phosphatase: 74 U/L (ref 38–126)
Anion gap: 6 (ref 5–15)
BUN: 10 mg/dL (ref 6–20)
CO2: 26 mmol/L (ref 22–32)
Calcium: 8.8 mg/dL — ABNORMAL LOW (ref 8.9–10.3)
Chloride: 103 mmol/L (ref 98–111)
Creatinine, Ser: 0.65 mg/dL (ref 0.61–1.24)
GFR, Estimated: >60 mL/min (ref >60)
Glucose, Bld: 96 mg/dL (ref 70–99)
Potassium: 3.6 mmol/L (ref 3.5–5.1)
Sodium: 135 mmol/L (ref 135–145)
Total Bilirubin: 4.3 mg/dL — ABNORMAL HIGH (ref 0.3–1.2)
Total Protein: 7.8 g/dL (ref 6.5–8.1)

## 2020-12-30 LAB — LIPASE, BLOOD: Lipase: 33 U/L (ref 11–51)

## 2020-12-30 LAB — RESP PANEL BY RT-PCR (FLU A&B, COVID) ARPGX2
Influenza A by PCR: NEGATIVE
Influenza B by PCR: NEGATIVE
SARS Coronavirus 2 by RT PCR: NEGATIVE

## 2020-12-30 IMAGING — CT CT HEAD W/O CM
3 series · 15 of 47 positions shown, 18 images · non-contrast
Comparison: None.

CLINICAL DATA: Headache

EXAM:
CT HEAD WITHOUT CONTRAST
TECHNIQUE: Contiguous axial images were obtained from the base of the skull
through the vertex without intravenous contrast.

[Series 2: head w o · axial · 0.44mm/px · z∈[+1573,+1698]mm · 9 of 31 slices shown, 12 images]
[im 3/31  brain]
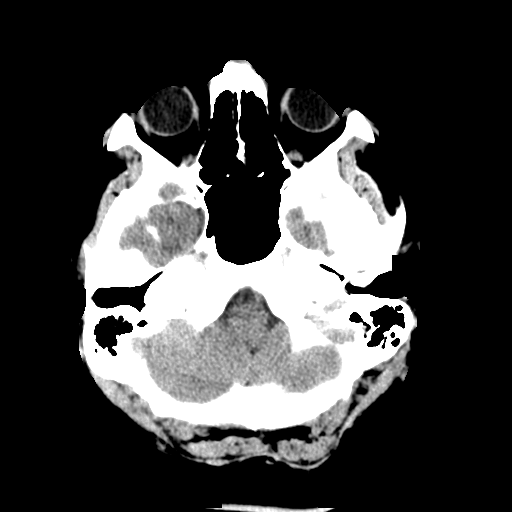
[im 3/31  bone]
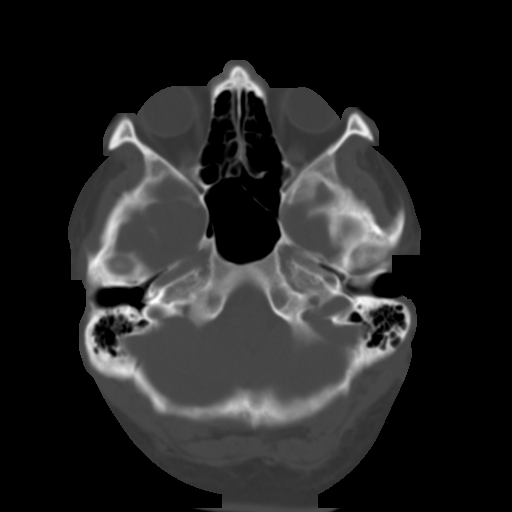
[im 6/31  brain]
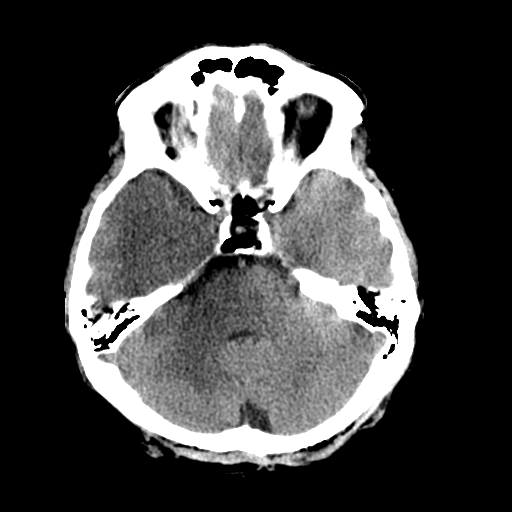
[im 9/31  brain]
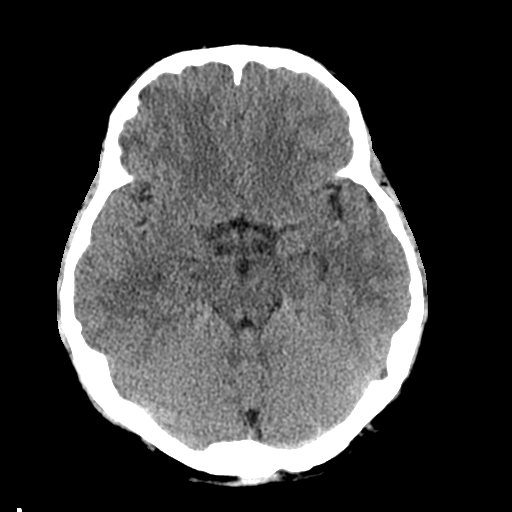
[im 12/31  brain]
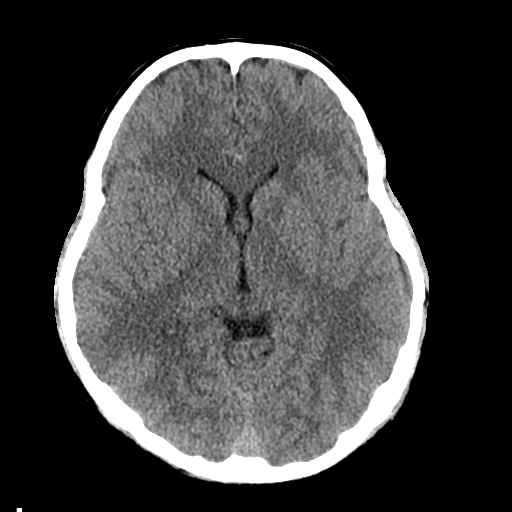
[im 16/31  brain]
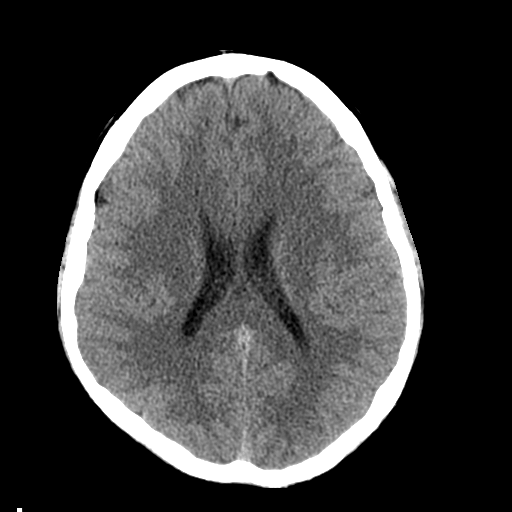
[im 16/31  bone]
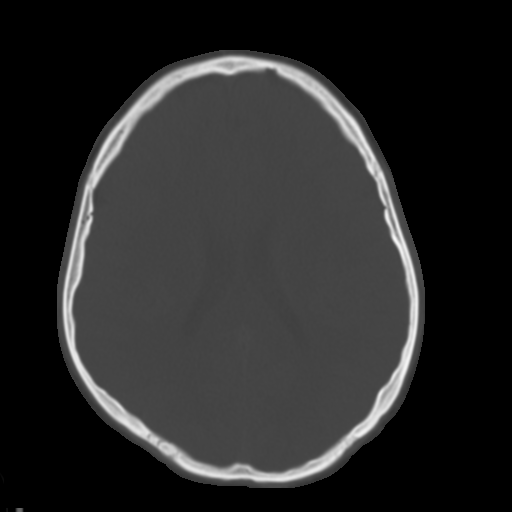
[im 19/31  brain]
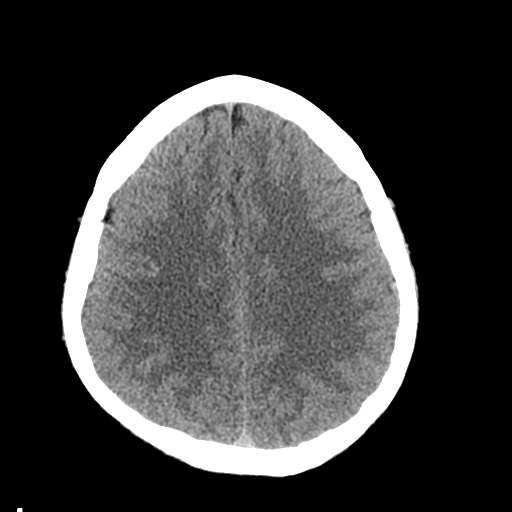
[im 22/31  brain]
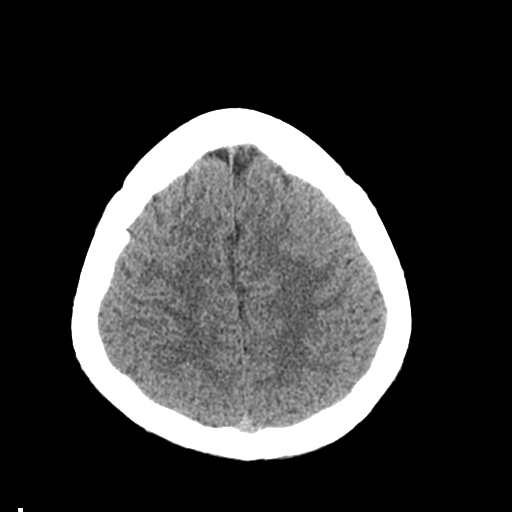
[im 25/31  brain]
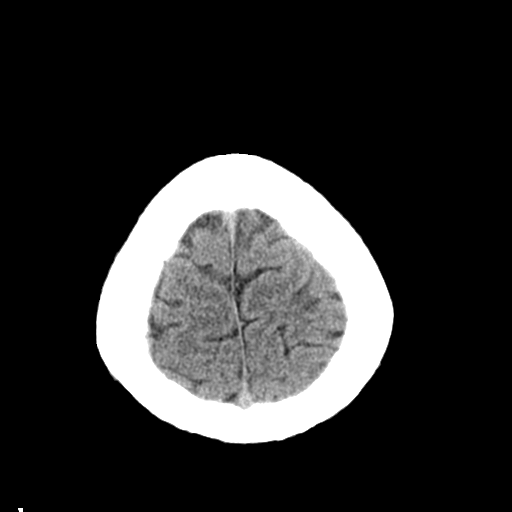
[im 28/31  brain]
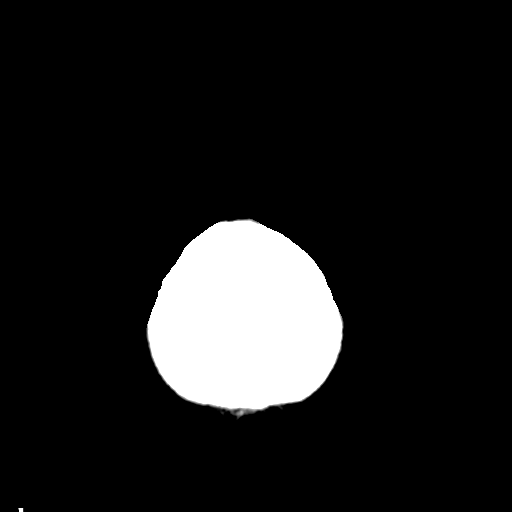
[im 28/31  bone]
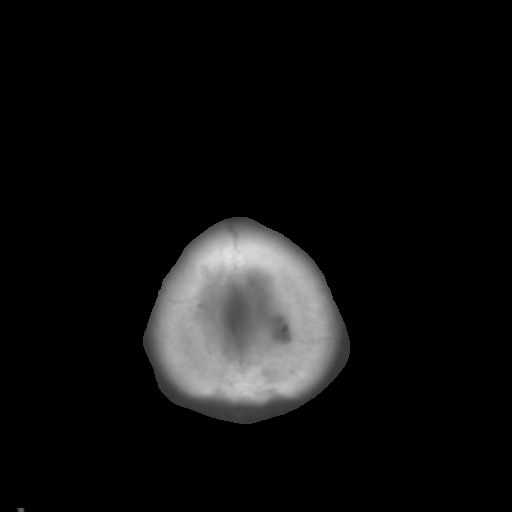

[Series 4: coronal soft · coronal · 0.31mm/px · 3 of 74 slices shown]
[im 25/74  brain]
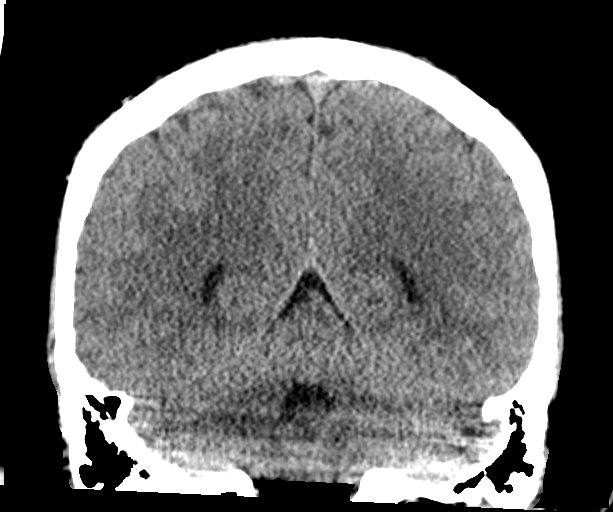
[im 33/74  brain]
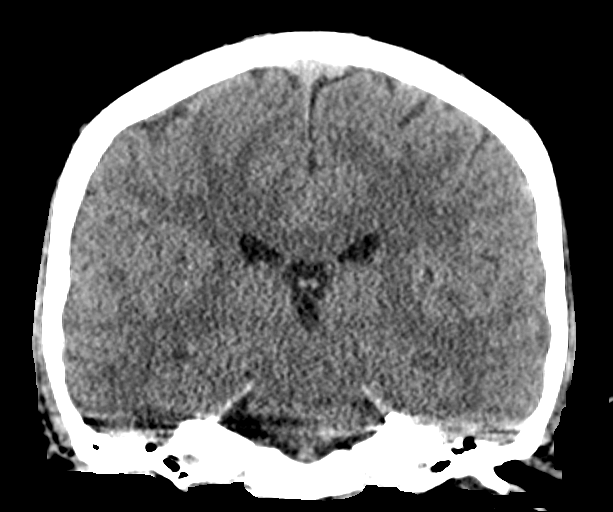
[im 41/74  brain]
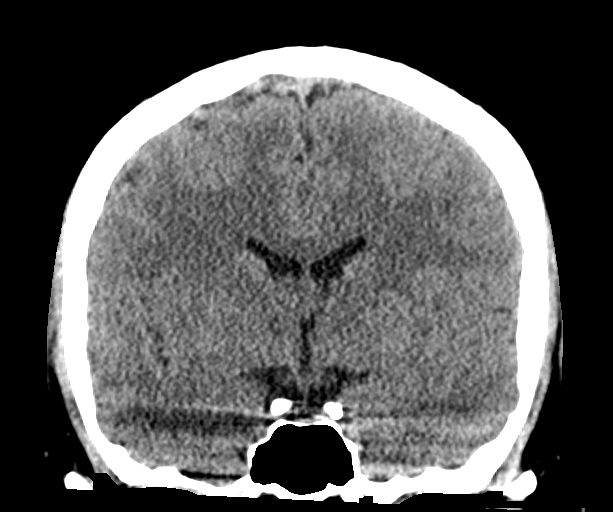

[Series 5: sagittal soft · sagittal · 0.31mm/px · 3 of 63 slices shown]
[im 21/63  brain]
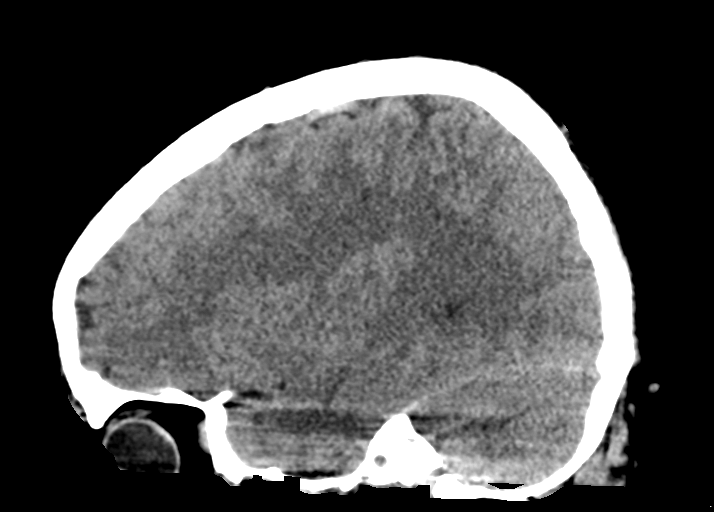
[im 32/63  brain]
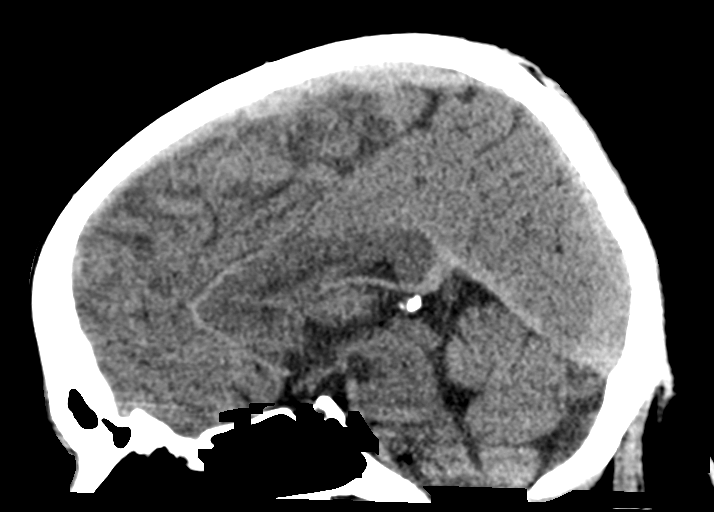
[im 42/63  brain]
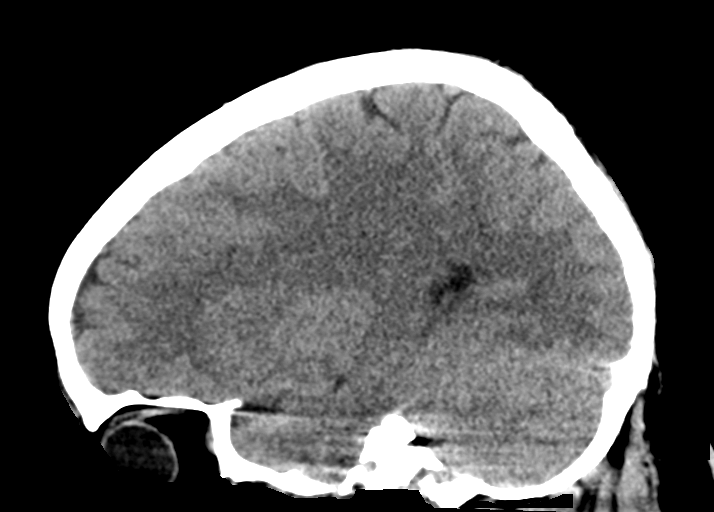

[15 of 47 positions shown; findings below may reference images not displayed]

FINDINGS: Brain: No evidence of acute territorial infarction, hemorrhage,
hydrocephalus,extra-axial collection or mass lesion/mass effect.
Normal gray-white differentiation. Ventricles are normal in size and
contour.

Vascular: No hyperdense vessel or unexpected calcification.

Skull: The skull is intact. No fracture or focal lesion identified.

Sinuses/Orbits: The visualized paranasal sinuses and mastoid air
cells are clear. The orbits and globes intact.

Other: None
IMPRESSION: No acute intracranial abnormality.

## 2020-12-30 IMAGING — DX DG CHEST 1V PORT
1 series · 1 of 1 positions shown · non-contrast
Comparison: March 27, 2020

CLINICAL DATA: Worsening symptoms

EXAM:
PORTABLE CHEST 1 VIEW

[chest ap grid]
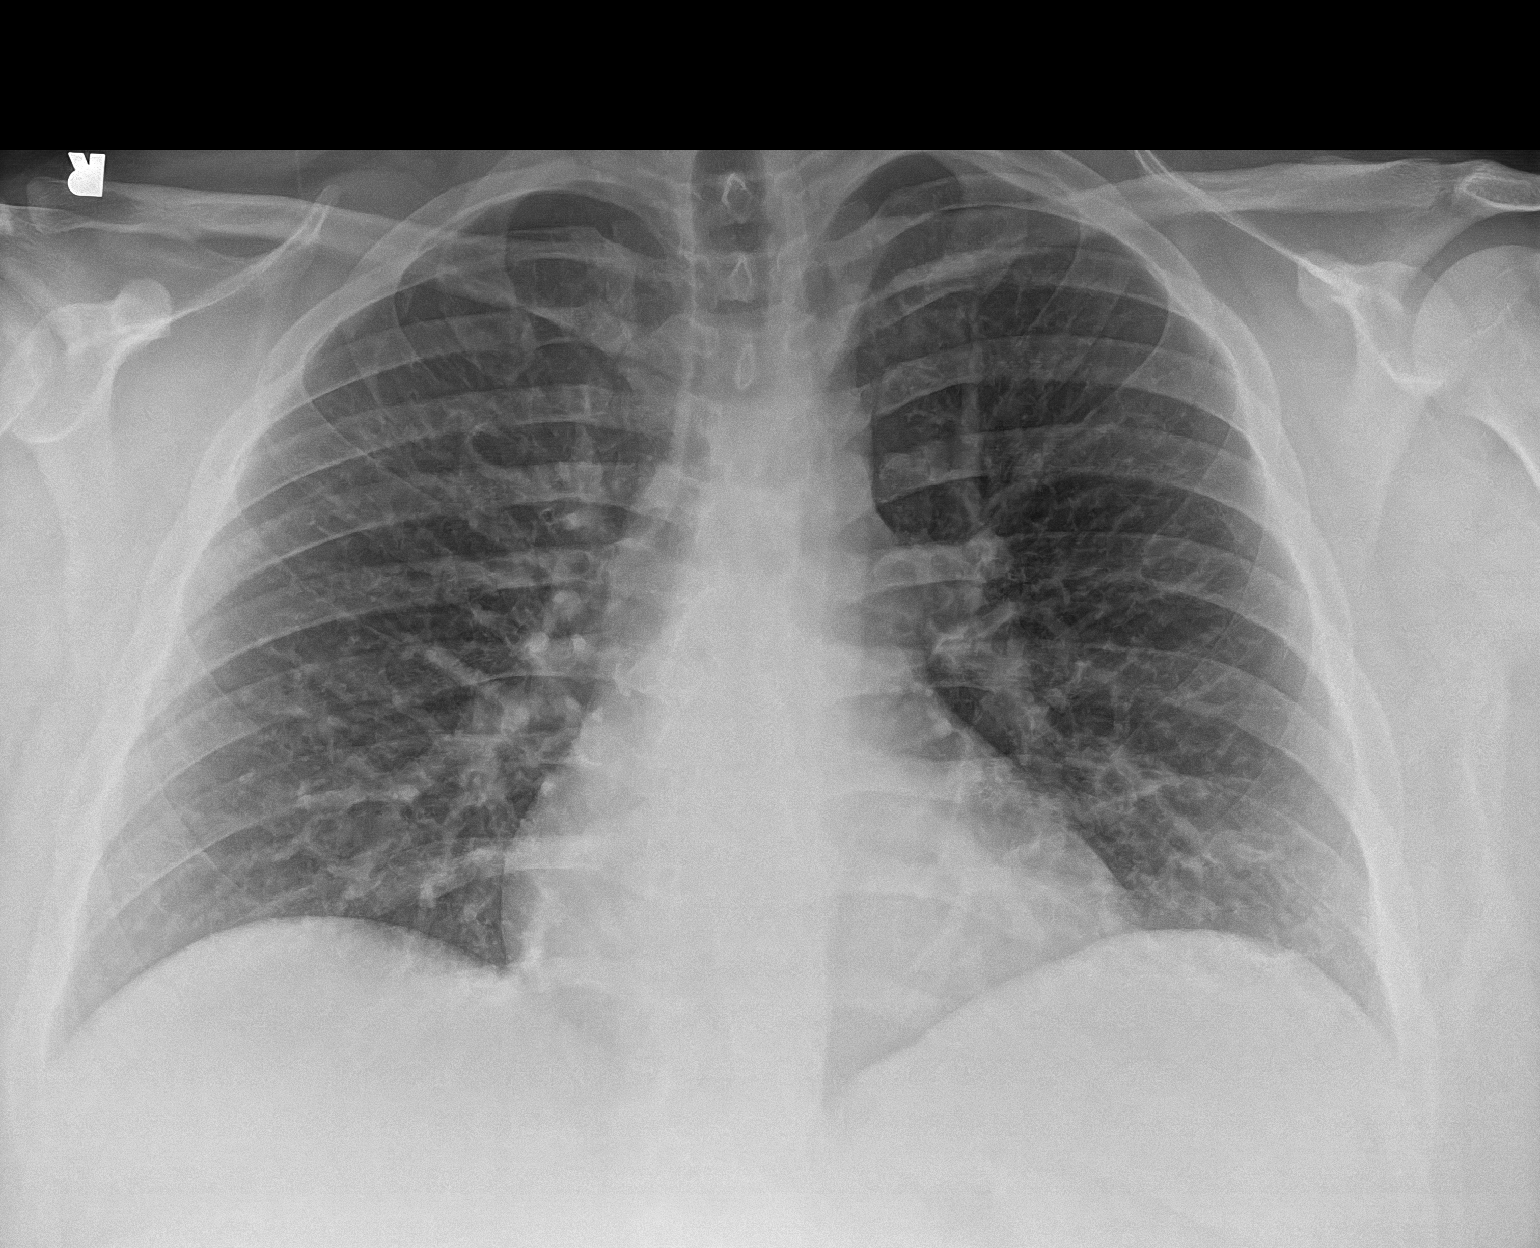

[1 of 1 positions shown; findings below may reference images not displayed]

FINDINGS: The heart size and mediastinal contours are within normal limits.
Both lungs are clear. The visualized skeletal structures are
unremarkable.
IMPRESSION: No active disease.

## 2020-12-30 MED ORDER — PIPERACILLIN-TAZOBACTAM 3.375 G IVPB 30 MIN
3.3750 g | Freq: Once | INTRAVENOUS | Status: AC
  Administered 2020-12-30: 3.375 g via INTRAVENOUS
  Filled 2020-12-30: qty 50

## 2020-12-30 MED ORDER — IOHEXOL 350 MG/ML SOLN
80.0000 mL | Freq: Once | INTRAVENOUS | Status: AC | PRN
  Administered 2020-12-30: 80 mL via INTRAVENOUS

## 2020-12-30 MED ORDER — PIPERACILLIN-TAZOBACTAM 3.375 G IVPB
3.3750 g | Freq: Three times a day (TID) | INTRAVENOUS | Status: DC
  Administered 2020-12-31 – 2021-01-02 (×6): 3.375 g via INTRAVENOUS
  Filled 2020-12-30 (×6): qty 50

## 2020-12-30 MED ORDER — ONDANSETRON HCL 4 MG/2ML IJ SOLN
4.0000 mg | Freq: Once | INTRAMUSCULAR | Status: AC
  Administered 2020-12-30: 4 mg via INTRAVENOUS
  Filled 2020-12-30: qty 2

## 2020-12-30 MED ORDER — FENTANYL CITRATE PF 50 MCG/ML IJ SOSY
50.0000 ug | PREFILLED_SYRINGE | Freq: Once | INTRAMUSCULAR | Status: AC
  Administered 2020-12-30: 50 ug via INTRAVENOUS
  Filled 2020-12-30: qty 1

## 2020-12-30 NOTE — Progress Notes (Signed)
Pharmacy Antibiotic Note  Alexander Villanueva is a 25 y.o. male admitted on 12/30/2020 with  concern for acute cholecystitis .  Pharmacy has been consulted for Zosyn dosing.  Plan: Zosyn 3.375g IV q8h (4 hour infusion).  Height: 5\' 7"  (170.2 cm) Weight: 129.3 kg (285 lb) IBW/kg (Calculated) : 66.1  Temp (24hrs), Avg:98.7 F (37.1 C), Min:98.7 F (37.1 C), Max:98.7 F (37.1 C)  Recent Labs  Lab 12/30/20 2135  WBC 12.6*  CREATININE 0.65    Estimated Creatinine Clearance: 185.7 mL/min (by C-G formula based on SCr of 0.65 mg/dL).    No Known Allergies   Thank you for allowing pharmacy to be a part of this patient's care.  2136, PharmD, BCPS  12/30/2020 11:17 PM

## 2020-12-30 NOTE — ED Notes (Signed)
Patient transported to CT 

## 2020-12-30 NOTE — ED Triage Notes (Signed)
Pt. States they have been having problems with their gall bladder. Pt. States the pain usually goes away but this pain started last night and it hasn't eased up.

## 2020-12-30 NOTE — ED Provider Notes (Signed)
  Provider Note MRN:  [id number]  Arrival date & time: 12/30/20    ED Course and Medical Decision Making  Assumed care from Dr. Jacqulyn Bath at shift change.  RUQ pain, CT with gallstones and some inflammatory findings.  Some abnormalities to LFTs.  To be admitted to medicine with general surgery following in consultation.  Procedures  Final Clinical Impressions(s) / ED Diagnoses     ICD-10-CM   1. 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 Pilar Plate, MD [facility] Emergency Medicine [facility] [email or web address]    Sabas Sous, MD 12/30/20 [phone]

## 2020-12-30 NOTE — ED Provider Notes (Signed)
[facility] EMERGENCY DEPARTMENT Provider Note   CSN: [id number] Arrival date & time: 12/30/20  2107     History Chief Complaint  Patient presents with   Abdominal Pain    Alexander Villanueva is a 25 y.o. male.  The history is provided by the patient.  Abdominal Pain Pain location:  RUQ Pain quality: aching, bloating, fullness, gnawing and squeezing   Pain radiates to:  Epigastric region Pain severity:  Severe Onset quality:  Gradual Duration:  24 hours Timing:  Constant Progression:  Waxing and waning Chronicity:  Recurrent Context: not alcohol use, not awakening from sleep, not diet changes, not eating, not laxative use, not medication withdrawal, not previous surgeries, not recent illness, not recent sexual activity, not recent travel, not retching, not sick contacts, not suspicious food intake and not trauma   Relieved by:  Nothing Worsened by:  Eating Ineffective treatments:  Acetaminophen, antacids, belching, NSAIDs, lying down and heat Associated symptoms: nausea and vomiting   Associated symptoms: no anorexia, no belching, no chest pain, no chills, no constipation, no cough, no diarrhea, no dysuria, no fatigue, no fever, no flatus, no hematemesis, no hematochezia, no hematuria, no melena, no shortness of breath and no sore throat   Risk factors: obesity   Risk factors: no NSAID use       Past Medical History:  Diagnosis Date   Asthma    Concussion     There are no problems to display for this patient.   History reviewed. No pertinent surgical history.     History reviewed. No pertinent family history.  Social History   Tobacco Use   Smoking status: Some Days    Packs/day: 0.50    Types: Cigarettes   Smokeless tobacco: Never  Vaping Use   Vaping Use: Former  Substance Use Topics   Alcohol use: Yes    Alcohol/week: 1.0 standard drink    Types: 1 Cans of beer per week    Comment: occasional   Drug use: No    Home Medications Prior to Admission  medications   Medication Sig Start Date End Date Taking? Authorizing Provider  acetaminophen (TYLENOL) 500 MG tablet Take 500 mg by mouth as needed.    [provider]  ibuprofen (ADVIL) 200 MG tablet Take 200 mg by mouth as needed.    [provider]  potassium chloride SA (KLOR-CON) 20 MEQ tablet Take 2 tablets (40 mEq total) by mouth 2 (two) times daily. 04/11/20   Wilson Singer, MD  traMADol (ULTRAM) 50 MG tablet Take 1 tablet (50 mg total) by mouth every 6 (six) hours as needed. 06/13/20   Geoffery Lyons, MD    Allergies    Patient has no known allergies.  Review of Systems   Review of Systems  Constitutional:  Negative for chills, fatigue and fever.  HENT:  Negative for sore throat.   Respiratory:  Negative for cough and shortness of breath.   Cardiovascular:  Negative for chest pain.  Gastrointestinal:  Positive for abdominal pain, nausea and vomiting. Negative for anorexia, constipation, diarrhea, flatus, hematemesis, hematochezia and melena.  Genitourinary:  Negative for dysuria and hematuria.  Musculoskeletal:  Negative for back pain.  Neurological:  Negative for weakness and light-headedness.   Physical Exam Updated Vital Signs BP 119/85 (BP Location: Right Arm)   Pulse 84   Temp 98.7 F (37.1 C) (Oral)   Resp 20   Ht 5\' 7"  (1.702 m)   Wt 129.3 kg   SpO2 100%  BMI 44.64 kg/m   Physical Exam Vitals and nursing note reviewed.  Constitutional:      General: He is not in acute distress.    Appearance: He is well-developed. He is not diaphoretic.  HENT:     Head: Normocephalic and atraumatic.  Eyes:     General: No scleral icterus.    Conjunctiva/sclera: Conjunctivae normal.  Cardiovascular:     Rate and Rhythm: Normal rate and regular rhythm.     Heart sounds: Normal heart sounds.  Pulmonary:     Effort: Pulmonary effort is normal. No respiratory distress.     Breath sounds: Normal breath sounds.  Abdominal:     Palpations: Abdomen is  soft.     Tenderness: There is abdominal tenderness in the right upper quadrant. Positive signs include Murphy's sign.  Musculoskeletal:     Cervical back: Normal range of motion and neck supple.  Skin:    General: Skin is warm and dry.  Neurological:     Mental Status: He is alert.  Psychiatric:        Behavior: Behavior normal.    ED Results / Procedures / Treatments   Labs (all labs ordered are listed, but only abnormal results are displayed) Labs Reviewed  CBC - Abnormal; Notable for the following components:      Result Value   WBC 12.6 (*)    All other components within normal limits  LIPASE, BLOOD  COMPREHENSIVE METABOLIC PANEL  URINALYSIS, ROUTINE W REFLEX MICROSCOPIC    EKG None  Radiology No results found.  Procedures Procedures   Medications Ordered in ED Medications  fentaNYL (SUBLIMAZE) injection 50 mcg (has no administration in time range)  ondansetron (ZOFRAN) injection 4 mg (has no administration in time range)    ED Course  I have reviewed the triage vital signs and the nursing notes.  Pertinent labs & imaging results that were available during my care of the patient were reviewed by me and considered in my medical decision making (see chart for details).  Clinical Course as of 12/30/20 2255  Mon Dec 30, 2020  2240 ALT(!): 240 [AH]  2240 AST(!): 248 [AH]  2240 Total Bilirubin(!): 4.3 [AH]  2240 WBC(!): 12.6 [AH]    Clinical Course User Index [AH] Arthor Captain, PA-C   MDM Rules/Calculators/A&P                           25 year old male here with complaint of right upper quadrant abdominal pain which has been intermittent for the last few months.  He has had constant right upper quadrant and epigastric abdominal pain with multiple episodes of nausea and headache since last night after eating dinner.The emergent DDX for RUQ pain includes but is not limited to Glabladder disease, PUD, Acute Hepatitis, Pancreatitis, pyelonephritis, Pneumonia,  Lower lobe PE/Infarct, Kidney stone, GERD, retrocecal appendicitis, Fitz-Hugh-Curtis syndrome, AAA, MI, Zoster. I ordered and reviewed labs that show CBC with white blood cell count of 12.6, lipase within normal limits, CMP with markedly elevated AST and ALT and bilirubin of 4.3.  I ordered a CT scan of the abdomen pelvis shows layering or stranding around the neck of the gallbladder.  Given these findings, tenderness in the right upper quadrant I have concern for cholecystitis versus choledocholithiasis.  Case discussed with Dr. Lovell Sheehan.  He asked for hospitalist admission, and this may be GI involvement.  Patient will also be given Zosyn.  His pain is improved significantly. Final Clinical Impression(s) /  ED Diagnoses Final diagnoses:  None    Rx / DC Orders ED Discharge Orders     None        Arthor Captain, PA-C 12/31/20 2109    Maia Plan, MD 01/01/21 0003

## 2020-12-31 ENCOUNTER — Encounter (HOSPITAL_COMMUNITY): Payer: Self-pay | Admitting: Family Medicine

## 2020-12-31 ENCOUNTER — Inpatient Hospital Stay (HOSPITAL_COMMUNITY): Payer: Medicaid Other

## 2020-12-31 DIAGNOSIS — D72829 Elevated white blood cell count, unspecified: Secondary | ICD-10-CM | POA: Diagnosis not present

## 2020-12-31 DIAGNOSIS — K819 Cholecystitis, unspecified: Secondary | ICD-10-CM | POA: Diagnosis not present

## 2020-12-31 DIAGNOSIS — K8020 Calculus of gallbladder without cholecystitis without obstruction: Secondary | ICD-10-CM

## 2020-12-31 DIAGNOSIS — R7401 Elevation of levels of liver transaminase levels: Secondary | ICD-10-CM | POA: Diagnosis not present

## 2020-12-31 DIAGNOSIS — K8081 Other cholelithiasis with obstruction: Secondary | ICD-10-CM

## 2020-12-31 DIAGNOSIS — K8000 Calculus of gallbladder with acute cholecystitis without obstruction: Secondary | ICD-10-CM

## 2020-12-31 LAB — COMPREHENSIVE METABOLIC PANEL
ALT: 250 U/L — ABNORMAL HIGH (ref 0–44)
AST: 216 U/L — ABNORMAL HIGH (ref 15–41)
Albumin: 3.7 g/dL (ref 3.5–5.0)
Alkaline Phosphatase: 73 U/L (ref 38–126)
Anion gap: 7 (ref 5–15)
BUN: 8 mg/dL (ref 6–20)
CO2: 27 mmol/L (ref 22–32)
Calcium: 8.6 mg/dL — ABNORMAL LOW (ref 8.9–10.3)
Chloride: 103 mmol/L (ref 98–111)
Creatinine, Ser: 0.61 mg/dL (ref 0.61–1.24)
GFR, Estimated: >60 mL/min (ref >60)
Glucose, Bld: 96 mg/dL (ref 70–99)
Potassium: 3.8 mmol/L (ref 3.5–5.1)
Sodium: 137 mmol/L (ref 135–145)
Total Bilirubin: 5.6 mg/dL — ABNORMAL HIGH (ref 0.3–1.2)
Total Protein: 7.1 g/dL (ref 6.5–8.1)

## 2020-12-31 LAB — CBC
HCT: 42.7 % (ref 39.0–52.0)
Hemoglobin: 13.5 g/dL (ref 13.0–17.0)
MCH: 26.2 pg (ref 26.0–34.0)
MCHC: 31.6 g/dL (ref 30.0–36.0)
MCV: 82.9 fL (ref 80.0–100.0)
Platelets: 283 10*3/uL (ref 150–400)
RBC: 5.15 MIL/uL (ref 4.22–5.81)
RDW: 14.4 % (ref 11.5–15.5)
WBC: 10.5 10*3/uL (ref 4.0–10.5)
nRBC: 0 % (ref 0.0–0.2)

## 2020-12-31 LAB — BILIRUBIN, DIRECT: Bilirubin, Direct: 2.8 mg/dL — ABNORMAL HIGH (ref 0.0–0.2)

## 2020-12-31 LAB — MAGNESIUM: Magnesium: 2.2 mg/dL (ref 1.7–2.4)

## 2020-12-31 MED ORDER — GADOBUTROL 1 MMOL/ML IV SOLN
10.0000 mL | Freq: Once | INTRAVENOUS | Status: AC | PRN
  Administered 2020-12-31: 10 mL via INTRAVENOUS

## 2020-12-31 MED ORDER — ONDANSETRON HCL 4 MG/2ML IJ SOLN
4.0000 mg | Freq: Four times a day (QID) | INTRAMUSCULAR | Status: DC | PRN
  Administered 2021-01-01: 4 mg via INTRAVENOUS

## 2020-12-31 MED ORDER — HEPARIN SODIUM (PORCINE) 5000 UNIT/ML IJ SOLN
5000.0000 [IU] | Freq: Three times a day (TID) | INTRAMUSCULAR | Status: DC
  Administered 2020-12-31 – 2021-01-02 (×3): 5000 [IU] via SUBCUTANEOUS
  Filled 2020-12-31 (×5): qty 1

## 2020-12-31 MED ORDER — ACETAMINOPHEN 650 MG RE SUPP: 650.0000 mg | Freq: Four times a day (QID) | RECTAL | Status: DC | PRN

## 2020-12-31 MED ORDER — FENTANYL CITRATE PF 50 MCG/ML IJ SOSY
12.5000 ug | PREFILLED_SYRINGE | INTRAMUSCULAR | Status: DC | PRN
  Administered 2020-12-31 – 2021-01-02 (×5): 50 ug via INTRAVENOUS
  Filled 2020-12-31 (×6): qty 1

## 2020-12-31 MED ORDER — ACETAMINOPHEN 325 MG PO TABS: 650.0000 mg | ORAL_TABLET | Freq: Four times a day (QID) | ORAL | Status: DC | PRN

## 2020-12-31 MED ORDER — SODIUM CHLORIDE 0.9 % IV SOLN: INTRAVENOUS | Status: DC

## 2020-12-31 MED ORDER — ONDANSETRON HCL 4 MG PO TABS
4.0000 mg | ORAL_TABLET | Freq: Four times a day (QID) | ORAL | Status: DC | PRN
  Administered 2020-12-31: 4 mg via ORAL
  Filled 2020-12-31: qty 1

## 2020-12-31 MED ORDER — OXYCODONE HCL 5 MG PO TABS
5.0000 mg | ORAL_TABLET | ORAL | Status: DC | PRN
  Administered 2020-12-31 – 2021-01-02 (×3): 5 mg via ORAL
  Filled 2020-12-31 (×3): qty 1

## 2020-12-31 NOTE — H&P (Signed)
TRH H&P    Patient Demographics:    Alexander Villanueva, is a 25 y.o. male  MRN: [id number]  DOB - 19-Mar-1996  Admit Date - 12/30/2020  Referring MD/NP/PA: Cammy CopaAbigail  Outpatient Primary MD for the patient is Elenore PaddyGray, Sarah E, NP  Patient coming from: Home  Chief complaint- Abdominal pain   HPI:    Alexander Villanueva  is a 25 y.o. male, with history of asthma, tobacco use disorder, concussion, presents the ED with abdominal pain.  Patient reports that he has been having trouble with his gallbladder for the last 4 months.  He reports that today's episode was ordinary because it was worse than normal.  He reports he has been to the hospital 4 times for the same complaint he has been told to watch what he eats, but he does not think that dietary changes help.  He reports symptoms he has the pain every day, other times he has the pain every other day.  The pain feels like cramping and sharp.  It is in the epigastric and right upper quadrant regions.  Positional changes do not help the pain or make it worse.  He has not tried any pain meds at home like Tylenol or ibuprofen.  He reports that the the pain is associated with nausea.  Pain usually occurs 10 minutes after a meal.  Last night he had pizza and the pain occurred immediately.  He reports it was stabbing pains and then throwing up.  The pain has been constant since then but it waxes and wanes.  Since dinner he has had 1 episode of emesis, but the nausea is persistent.  His last normal bowel movement was before dinner that night.  He denies diarrhea, but does report loose stools once or twice a day.  Patient denies any fever.  He has not noticed any change in the color of his skin or sclera.  He reports he was fatigued today.  He has no other complaints at this time.  Patient smokes but declines nicotine patch at this time.  He drinks once a month.  He does not use illicit drugs, he is  not vaccinated for COVID.  Patient is full code.    In the ED Temp 12.6, hemoglobin 14.7 Chemistry panel reveals a transaminitis with AST 248, ALT 240 Calcium 8.8 ED provider spoke with general surgery who recommended Zosyn, and will see in the a.m. Patient received fentanyl and Zofran UA pending CT scan shows cholelithiasis with mild inflammatory stranding    Review of systems:    In addition to the HPI above,  No Fever-chills, No Headache, No changes with Vision or hearing, No problems swallowing food or Liquids, No Chest pain, Cough or Shortness of Breath, No Blood in stool or Urine, No dysuria, No new skin rashes or bruises, No new joints pains-aches,  No new weakness, tingling, numbness in any extremity, No recent weight gain or loss, No polyuria, polydypsia or polyphagia, No significant Mental Stressors.  All other systems reviewed and are negative.    Past History  of the following :    Past Medical History:  Diagnosis Date   Asthma    Concussion       History reviewed. No pertinent surgical history.    Social History:      Social History   Tobacco Use   Smoking status: Some Days    Packs/day: 0.50    Types: Cigarettes   Smokeless tobacco: Never  Substance Use Topics   Alcohol use: Yes    Alcohol/week: 1.0 standard drink    Types: 1 Cans of beer per week    Comment: occasional       Family History :    History reviewed. No pertinent family history. Family history hypertension   Home Medications:   Prior to Admission medications   Medication Sig Start Date End Date Taking? Authorizing Provider  acetaminophen (TYLENOL) 500 MG tablet Take 500 mg by mouth as needed.    [provider]  ibuprofen (ADVIL) 200 MG tablet Take 200 mg by mouth as needed.    [provider]  potassium chloride SA (KLOR-CON) 20 MEQ tablet Take 2 tablets (40 mEq total) by mouth 2 (two) times daily. 04/11/20   Wilson Singer, MD  traMADol (ULTRAM)  50 MG tablet Take 1 tablet (50 mg total) by mouth every 6 (six) hours as needed. 06/13/20   Geoffery Lyons, MD     Allergies:    No Known Allergies   Physical Exam:   Vitals  Blood pressure 91/75, pulse 69, temperature 98.7 F (37.1 C), temperature source Oral, resp. rate 18, height 5\' 7"  (1.702 m), weight 126.4 kg, SpO2 96 %.  1.  General: Patient lying supine in bed,  no acute distress   2. Psychiatric: Alert and oriented x 3, mood and behavior normal for situation, pleasant and cooperative with exam   3. Neurologic: Speech and language are normal, face is symmetric, moves all 4 extremities voluntarily, at baseline without acute deficits on limited exam   4. HEENMT:  Head is atraumatic, normocephalic, pupils reactive to light, neck is supple, trachea is midline, mucous membranes are moist   5. Respiratory : Lungs are clear to auscultation bilaterally without wheezing, rhonchi, rales, no cyanosis, no increase in work of breathing or accessory muscle use   6. Cardiovascular : Heart rate normal, rhythm is regular, no murmurs, rubs or gallops, no peripheral edema, peripheral pulses palpated   7. Gastrointestinal:  Abdomen is soft, nondistended, moderately tender to the epigastric and right upper quadrant region, bowel sounds active, no masses or organomegaly palpated   8. Skin:  Skin is warm, dry and intact without rashes, acute lesions, or ulcers on limited exam   9.Musculoskeletal:  No acute deformities or trauma, no asymmetry in tone, no peripheral edema, peripheral pulses palpated, no tenderness to palpation in the extremities     Data Review:    CBC Recent Labs  Lab 12/30/20 2135  WBC 12.6*  HGB 14.7  HCT 44.1  PLT 323  MCV 81.7  MCH 27.2  MCHC 33.3  RDW 14.3   ------------------------------------------------------------------------------------------------------------------  Results for orders placed or performed during the hospital encounter of 12/30/20  (from the past 48 hour(s))  Lipase, blood     Status: None   Collection Time: 12/30/20  9:35 PM  Result Value Ref Range   Lipase 33 11 - 51 U/L    Comment: Performed at [facility], [address] [place], [place]  Comprehensive metabolic panel     Status: Abnormal  Collection Time: 12/30/20  9:35 PM  Result Value Ref Range   Sodium 135 135 - 145 mmol/L   Potassium 3.6 3.5 - 5.1 mmol/L   Chloride 103 98 - 111 mmol/L   CO2 26 22 - 32 mmol/L   Glucose, Bld 96 70 - 99 mg/dL    Comment: Glucose reference range applies only to samples taken after fasting for at least 8 hours.   BUN 10 6 - 20 mg/dL   Creatinine, Ser 8.41 0.61 - 1.24 mg/dL   Calcium 8.8 (L) 8.9 - 10.3 mg/dL   Total Protein 7.8 6.5 - 8.1 g/dL   Albumin 4.1 3.5 - 5.0 g/dL   AST 324 (H) 15 - 41 U/L   ALT 240 (H) 0 - 44 U/L   Alkaline Phosphatase 74 38 - 126 U/L   Total Bilirubin 4.3 (H) 0.3 - 1.2 mg/dL   GFR, Estimated >40 >10 mL/min    Comment: (NOTE) Calculated using the CKD-EPI Creatinine Equation (2021)    Anion gap 6 5 - 15    Comment: Performed at [facility], [address], [place], [place] [address]  CBC     Status: Abnormal   Collection Time: 12/30/20  9:35 PM  Result Value Ref Range   WBC 12.6 (H) 4.0 - 10.5 K/uL   RBC 5.40 4.22 - 5.81 MIL/uL   Hemoglobin 14.7 13.0 - 17.0 g/dL   HCT 66.4 40.3 - 47.4 %   MCV 81.7 80.0 - 100.0 fL   MCH 27.2 26.0 - 34.0 pg   MCHC 33.3 30.0 - 36.0 g/dL   RDW 25.9 56.3 - 87.5 %   Platelets 323 150 - 400 K/uL   nRBC 0.0 0.0 - 0.2 %    Comment: Performed at [facility], [address] [place], [place] [address]  Resp Panel by RT-PCR (Flu A&B, Covid) Nasopharyngeal Swab     Status: None   Collection Time: 12/30/20 10:47 PM   Specimen: Nasopharyngeal Swab; Nasopharyngeal(NP) swabs in vial transport medium  Result Value Ref Range   SARS Coronavirus 2 by RT PCR NEGATIVE NEGATIVE    Comment: (NOTE) SARS-CoV-2 target nucleic acids are NOT DETECTED.  The SARS-CoV-2  RNA is generally detectable in upper respiratory specimens during the acute phase of infection. The lowest concentration of SARS-CoV-2 viral copies this assay can detect is 138 copies/mL. A negative result does not preclude SARS-Cov-2 infection and should not be used as the sole basis for treatment or other patient management decisions. A negative result may occur with  improper specimen collection/handling, submission of specimen other than nasopharyngeal swab, presence of viral mutation(s) within the areas targeted by this assay, and inadequate number of viral copies(<138 copies/mL). A negative result must be combined with clinical observations, patient history, and epidemiological information. The expected result is Negative.  Fact Sheet for Patients:  BloggerCourse.com  Fact Sheet for Healthcare Providers:  SeriousBroker.it  This test is no t yet approved or cleared by the [place] FDA and  has been authorized for detection and/or diagnosis of SARS-CoV-2 by FDA under an Emergency Use Authorization (EUA). This EUA will remain  in effect (meaning this test can be used) for the duration of the COVID-19 declaration under Section 564(b)(1) of the Act, 21 U.S.C.section 360bbb-3(b)(1), unless the authorization is terminated  or revoked sooner.       Influenza A by PCR NEGATIVE NEGATIVE   Influenza B by PCR NEGATIVE NEGATIVE    Comment: (NOTE) The Xpert Xpress SARS-CoV-2/FLU/RSV plus assay  is intended as an aid in the diagnosis of influenza from Nasopharyngeal swab specimens and should not be used as a sole basis for treatment. Nasal washings and aspirates are unacceptable for Xpert Xpress SARS-CoV-2/FLU/RSV testing.  Fact Sheet for Patients: BloggerCourse.com  Fact Sheet for Healthcare Providers: SeriousBroker.it  This test is not yet approved or cleared by the [place]  FDA and has been authorized for detection and/or diagnosis of SARS-CoV-2 by FDA under an Emergency Use Authorization (EUA). This EUA will remain in effect (meaning this test can be used) for the duration of the COVID-19 declaration under Section 564(b)(1) of the Act, 21 U.S.C. section 360bbb-3(b)(1), unless the authorization is terminated or revoked.  Performed at [facility], [address] [place], [place] [address]     Chemistries  Recent Labs  Lab 12/30/20 2135  NA 135  K 3.6  CL 103  CO2 26  GLUCOSE 96  BUN 10  CREATININE 0.65  CALCIUM 8.8*  AST 248*  ALT 240*  ALKPHOS 74  BILITOT 4.3*   ------------------------------------------------------------------------------------------------------------------  ------------------------------------------------------------------------------------------------------------------ GFR: Estimated Creatinine Clearance: 183.2 mL/min (by C-G formula based on SCr of 0.65 mg/dL). Liver Function Tests: Recent Labs  Lab 12/30/20 2135  AST 248*  ALT 240*  ALKPHOS 74  BILITOT 4.3*  PROT 7.8  ALBUMIN 4.1   Recent Labs  Lab 12/30/20 2135  LIPASE 33   No results for input(s): AMMONIA in the last 168 hours. Coagulation Profile: No results for input(s): INR, PROTIME in the last 168 hours. Cardiac Enzymes: No results for input(s): CKTOTAL, CKMB, CKMBINDEX, TROPONINI in the last 168 hours. BNP (last 3 results) No results for input(s): PROBNP in the last 8760 hours. HbA1C: No results for input(s): HGBA1C in the last 72 hours. CBG: No results for input(s): GLUCAP in the last 168 hours. Lipid Profile: No results for input(s): CHOL, HDL, LDLCALC, TRIG, CHOLHDL, LDLDIRECT in the last 72 hours. Thyroid Function Tests: No results for input(s): TSH, T4TOTAL, FREET4, T3FREE, THYROIDAB in the last 72 hours. Anemia Panel: No results for input(s): VITAMINB12, FOLATE, FERRITIN, TIBC, IRON, RETICCTPCT in the last 72  hours.  --------------------------------------------------------------------------------------------------------------- Urine analysis:    Component Value Date/Time   COLORURINE YELLOW 06/13/2020 0414   APPEARANCEUR CLEAR 06/13/2020 0414   LABSPEC 1.021 06/13/2020 0414   PHURINE 5.0 06/13/2020 0414   GLUCOSEU NEGATIVE 06/13/2020 0414   HGBUR NEGATIVE 06/13/2020 0414   BILIRUBINUR NEGATIVE 06/13/2020 0414   KETONESUR NEGATIVE 06/13/2020 0414   PROTEINUR NEGATIVE 06/13/2020 0414   UROBILINOGEN 0.2 02/25/2015 1203   NITRITE NEGATIVE 06/13/2020 0414   LEUKOCYTESUR NEGATIVE 06/13/2020 0414      Imaging Results:    CT ABDOMEN PELVIS W CONTRAST  Result Date: 12/30/2020 CLINICAL DATA:  Abdominal pain.  Biliary obstruction suspected. EXAM: CT ABDOMEN AND PELVIS WITH CONTRAST TECHNIQUE: Multidetector CT imaging of the abdomen and pelvis was performed using the standard protocol following bolus administration of intravenous contrast. CONTRAST:  63mL OMNIPAQUE IOHEXOL 350 MG/ML SOLN COMPARISON:  CT chest abdomen and pelvis 04/02/2020 FINDINGS: Lower chest: No acute abnormality. Hepatobiliary: Small layering gallstones are present. There is questionable mild inflammatory stranding surrounding the neck of the gallbladder. There is no biliary ductal dilatation identified. The liver appears within normal limits Pancreas: Unremarkable. No pancreatic ductal dilatation or surrounding inflammatory changes. Spleen: Mildly enlarged, similar to the prior study. Adrenals/Urinary Tract: Adrenal glands are unremarkable. Kidneys are normal, without renal calculi, focal lesion, or hydronephrosis. Bladder is unremarkable. Stomach/Bowel: Stomach is within normal limits. Appendix appears  normal. No evidence of bowel wall thickening, distention, or inflammatory changes. Vascular/Lymphatic: No significant vascular findings are present. No enlarged abdominal or pelvic lymph nodes. Reproductive: Prostate is unremarkable.  Other: There is no ascites or free air. Again seen is a small fat containing umbilical hernia. Musculoskeletal: No acute or significant osseous findings. IMPRESSION: 1. Cholelithiasis. There is questionable mild inflammatory stranding surrounding the neck of the gallbladder. Please correlate clinically for acute cholecystitis. Consider ultrasound. Electronically Signed   By: Darliss Cheney M.D.   On: 12/30/2020 22:41       Assessment & Plan:    Active Problems:   Cholecystitis   Transaminitis   Leukocytosis   Cholecystitis Cholelithiasis with mild stranding, leukocytosis 12.6, positive Murphy sign ED provider spoke with general surgery who recommended Zosyn, continue Zosyn Appreciate GEN surge recs Continue pain control pain scale No jaundice or fever Continue to monitor Leukocytosis Likely secondary to above continue to monitor Transaminitis AST and ALT markedly elevated in the setting of cholelithiasis and right upper quadrant pain Concern for choledocholithiasis MRCP ordered, consult placed for GI -but no call made Trend in the a.m.   DVT Prophylaxis-   Heparin- SCDs   AM Labs Ordered, also please review Full Orders  Family Communication: No family at bedside  Code Status: Full  Admission status: Inpatient :The appropriate admission status for this patient is INPATIENT. Inpatient status is judged to be reasonable and necessary in order to provide the required intensity of service to ensure the patient's safety. The patient's presenting symptoms, physical exam findings, and initial radiographic and laboratory data in the context of their chronic comorbidities is felt to place them at high risk for further clinical deterioration. Furthermore, it is not anticipated that the patient will be medically stable for discharge from the hospital within 2 midnights of admission. The following factors support the admission status of inpatient.     The patient's presenting symptoms  include abdominal pain. The worrisome physical exam findings include Murphy sign. The initial radiographic and laboratory data are worrisome because of cholelithiasis, leukocytosis, transaminitis. The chronic co-morbidities include asthma.       * I certify that at the point of admission it is my clinical judgment that the patient will require inpatient hospital care spanning beyond 2 midnights from the point of admission due to high intensity of service, high risk for further deterioration and high frequency of surveillance required.*  Time spent in minutes : 65   Krishay Faro B Zierle-Ghosh DO

## 2020-12-31 NOTE — Consult Note (Signed)
Reason for Consult: Cholelithiasis, transaminitis, jaundice Referring Physician: Dr. Kathie RhodesMadera  Alexander Villanueva is an 25 y.o. male.  HPI: Patient is a 25 year old white male who presented to the emergency room with worsening right upper quadrant abdominal pain, nausea, and decreased appetite.  He states he has had multiple episodes over the past year.  He was noted as an outpatient to have mildly elevated liver enzyme tests.  He has noticed his urine turning orange.  He denies any fever or chills.  CT scan of the abdomen revealed cholelithiasis with some edema present.  No biliary dilatation was noted.  He does have transaminitis with an elevated total bilirubin of 4.3.  He was admitted to the hospital for further evaluation and treatment.  This morning, he still has upper abdominal pain but no nausea.  He has been trying to adjust his diet so that he does not trigger an attack.  Past Medical History:  Diagnosis Date   Asthma    Concussion     History reviewed. No pertinent surgical history.  History reviewed. No pertinent family history.  Social History:  reports that he has been smoking cigarettes. He has been smoking an average of .5 packs per day. He has never used smokeless tobacco. He reports current alcohol use of about 1.0 standard drink per week. He reports that he does not use drugs.  Allergies: No Known Allergies  Medications: I have reviewed the patient's current medications. Prior to Admission:  Medications Prior to Admission  Medication Sig Dispense Refill Last Dose   potassium chloride SA (KLOR-CON) 20 MEQ tablet Take 2 tablets (40 mEq total) by mouth 2 (two) times daily. (Patient not taking: Reported on 12/31/2020) 60 tablet 0 Not Taking   traMADol (ULTRAM) 50 MG tablet Take 1 tablet (50 mg total) by mouth every 6 (six) hours as needed. (Patient not taking: Reported on 12/31/2020) 10 tablet 0 Not Taking    Results for orders placed or performed during the hospital encounter of  12/30/20 (from the past 48 hour(s))  Lipase, blood     Status: None   Collection Time: 12/30/20  9:35 PM  Result Value Ref Range   Lipase 33 11 - 51 U/L    Comment: Performed at [facility], [address] [place], [place] [address]  Comprehensive metabolic panel     Status: Abnormal   Collection Time: 12/30/20  9:35 PM  Result Value Ref Range   Sodium 135 135 - 145 mmol/L   Potassium 3.6 3.5 - 5.1 mmol/L   Chloride 103 98 - 111 mmol/L   CO2 26 22 - 32 mmol/L   Glucose, Bld 96 70 - 99 mg/dL    Comment: Glucose reference range applies only to samples taken after fasting for at least 8 hours.   BUN 10 6 - 20 mg/dL   Creatinine, Ser 6.040.65 0.61 - 1.24 mg/dL   Calcium 8.8 (L) 8.9 - 10.3 mg/dL   Total Protein 7.8 6.5 - 8.1 g/dL   Albumin 4.1 3.5 - 5.0 g/dL   AST 540248 (H) 15 - 41 U/L   ALT 240 (H) 0 - 44 U/L   Alkaline Phosphatase 74 38 - 126 U/L   Total Bilirubin 4.3 (H) 0.3 - 1.2 mg/dL   GFR, Estimated >98>60 >11>60 mL/min    Comment: (NOTE) Calculated using the CKD-EPI Creatinine Equation (2021)    Anion gap 6 5 - 15    Comment: Performed at [facility], [address] [place], [place] [address]  CBC     Status: Abnormal   Collection Time: 12/30/20  9:35 PM  Result Value Ref Range   WBC 12.6 (H) 4.0 - 10.5 K/uL   RBC 5.40 4.22 - 5.81 MIL/uL   Hemoglobin 14.7 13.0 - 17.0 g/dL   HCT 16.1 09.6 - 04.5 %   MCV 81.7 80.0 - 100.0 fL   MCH 27.2 26.0 - 34.0 pg   MCHC 33.3 30.0 - 36.0 g/dL   RDW 40.9 81.1 - 91.4 %   Platelets 323 150 - 400 K/uL   nRBC 0.0 0.0 - 0.2 %    Comment: Performed at [facility], [address], [place], [place] [address]  Resp Panel by RT-PCR (Flu A&B, Covid) Nasopharyngeal Swab     Status: None   Collection Time: 12/30/20 10:47 PM   Specimen: Nasopharyngeal Swab; Nasopharyngeal(NP) swabs in vial transport medium  Result Value Ref Range   SARS Coronavirus 2 by RT PCR NEGATIVE NEGATIVE    Comment: (NOTE) SARS-CoV-2 target nucleic acids are NOT DETECTED.  The  SARS-CoV-2 RNA is generally detectable in upper respiratory specimens during the acute phase of infection. The lowest concentration of SARS-CoV-2 viral copies this assay can detect is 138 copies/mL. A negative result does not preclude SARS-Cov-2 infection and should not be used as the sole basis for treatment or other patient management decisions. A negative result may occur with  improper specimen collection/handling, submission of specimen other than nasopharyngeal swab, presence of viral mutation(s) within the areas targeted by this assay, and inadequate number of viral copies(<138 copies/mL). A negative result must be combined with clinical observations, patient history, and epidemiological information. The expected result is Negative.  Fact Sheet for Patients:  BloggerCourse.com  Fact Sheet for Healthcare Providers:  SeriousBroker.it  This test is no t yet approved or cleared by the [place] FDA and  has been authorized for detection and/or diagnosis of SARS-CoV-2 by FDA under an Emergency Use Authorization (EUA). This EUA will remain  in effect (meaning this test can be used) for the duration of the COVID-19 declaration under Section 564(b)(1) of the Act, 21 U.S.C.section 360bbb-3(b)(1), unless the authorization is terminated  or revoked sooner.       Influenza A by PCR NEGATIVE NEGATIVE   Influenza B by PCR NEGATIVE NEGATIVE    Comment: (NOTE) The Xpert Xpress SARS-CoV-2/FLU/RSV plus assay is intended as an aid in the diagnosis of influenza from Nasopharyngeal swab specimens and should not be used as a sole basis for treatment. Nasal washings and aspirates are unacceptable for Xpert Xpress SARS-CoV-2/FLU/RSV testing.  Fact Sheet for Patients: BloggerCourse.com  Fact Sheet for Healthcare Providers: SeriousBroker.it  This test is not yet approved or cleared by the  [place] FDA and has been authorized for detection and/or diagnosis of SARS-CoV-2 by FDA under an Emergency Use Authorization (EUA). This EUA will remain in effect (meaning this test can be used) for the duration of the COVID-19 declaration under Section 564(b)(1) of the Act, 21 U.S.C. section 360bbb-3(b)(1), unless the authorization is terminated or revoked.  Performed at [facility], [address] [place], [place] [address]   Comprehensive metabolic panel     Status: Abnormal   Collection Time: 12/31/20  5:16 AM  Result Value Ref Range   Sodium 137 135 - 145 mmol/L   Potassium 3.8 3.5 - 5.1 mmol/L   Chloride 103 98 - 111 mmol/L   CO2 27 22 - 32 mmol/L   Glucose, Bld 96 70 - 99 mg/dL  Comment: Glucose reference range applies only to samples taken after fasting for at least 8 hours.   BUN 8 6 - 20 mg/dL   Creatinine, Ser 7.25 0.61 - 1.24 mg/dL   Calcium 8.6 (L) 8.9 - 10.3 mg/dL   Total Protein 7.1 6.5 - 8.1 g/dL   Albumin 3.7 3.5 - 5.0 g/dL   AST 366 (H) 15 - 41 U/L   ALT 250 (H) 0 - 44 U/L   Alkaline Phosphatase 73 38 - 126 U/L   Total Bilirubin 5.6 (H) 0.3 - 1.2 mg/dL   GFR, Estimated >44 >03 mL/min    Comment: (NOTE) Calculated using the CKD-EPI Creatinine Equation (2021)    Anion gap 7 5 - 15    Comment: Performed at [facility], [address] [place], [place] [address]  Magnesium     Status: None   Collection Time: 12/31/20  5:16 AM  Result Value Ref Range   Magnesium 2.2 1.7 - 2.4 mg/dL    Comment: Performed at [facility], [address] [place], [place] [address]  CBC     Status: None   Collection Time: 12/31/20  5:16 AM  Result Value Ref Range   WBC 10.5 4.0 - 10.5 K/uL   RBC 5.15 4.22 - 5.81 MIL/uL   Hemoglobin 13.5 13.0 - 17.0 g/dL   HCT 75.6 43.3 - 29.5 %   MCV 82.9 80.0 - 100.0 fL   MCH 26.2 26.0 - 34.0 pg   MCHC 31.6 30.0 - 36.0 g/dL   RDW 18.8 41.6 - 60.6 %   Platelets 283 150 - 400 K/uL   nRBC 0.0 0.0 - 0.2 %    Comment: Performed at  [facility], [address], [place], [place] [address]    CT ABDOMEN PELVIS W CONTRAST  Result Date: 12/30/2020 CLINICAL DATA:  Abdominal pain.  Biliary obstruction suspected. EXAM: CT ABDOMEN AND PELVIS WITH CONTRAST TECHNIQUE: Multidetector CT imaging of the abdomen and pelvis was performed using the standard protocol following bolus administration of intravenous contrast. CONTRAST:  60mL OMNIPAQUE IOHEXOL 350 MG/ML SOLN COMPARISON:  CT chest abdomen and pelvis 04/02/2020 FINDINGS: Lower chest: No acute abnormality. Hepatobiliary: Small layering gallstones are present. There is questionable mild inflammatory stranding surrounding the neck of the gallbladder. There is no biliary ductal dilatation identified. The liver appears within normal limits Pancreas: Unremarkable. No pancreatic ductal dilatation or surrounding inflammatory changes. Spleen: Mildly enlarged, similar to the prior study. Adrenals/Urinary Tract: Adrenal glands are unremarkable. Kidneys are normal, without renal calculi, focal lesion, or hydronephrosis. Bladder is unremarkable. Stomach/Bowel: Stomach is within normal limits. Appendix appears normal. No evidence of bowel wall thickening, distention, or inflammatory changes. Vascular/Lymphatic: No significant vascular findings are present. No enlarged abdominal or pelvic lymph nodes. Reproductive: Prostate is unremarkable. Other: There is no ascites or free air. Again seen is a small fat containing umbilical hernia. Musculoskeletal: No acute or significant osseous findings. IMPRESSION: 1. Cholelithiasis. There is questionable mild inflammatory stranding surrounding the neck of the gallbladder. Please correlate clinically for acute cholecystitis. Consider ultrasound. Electronically Signed   By: Darliss Cheney M.D.   On: 12/30/2020 22:41    ROS:  Pertinent items are noted in HPI.  Blood pressure 91/75, pulse 69, temperature 98.7 F (37.1 C), temperature source Oral, resp. rate 18, height 5'  7" (1.702 m), weight 126.4 kg, SpO2 96 %. Physical Exam: Pleasant well-developed well-nourished white male no acute distress Head is normocephalic, atraumatic Eyes are with mild scleral icterus Lungs clear to  auscultation with good breath sounds bilaterally Heart examination reveals regular rate and rhythm without S3, S4, murmurs Abdomen soft with tenderness to palpation in the upper abdomen, especially the right upper quadrant.  No rigidity is noted.  Assessment/Plan: Impression: Cholelithiasis, transaminitis, elevated bilirubin.  He does not have a leukocytosis.  Need to rule out common bile duct stone. Plan: We will get MRCP today.  Further management is pending those results.  Would continue Zosyn for now.  Patient will need cholecystectomy during this admission.  Franky Macho 12/31/2020, 8:09 AM

## 2020-12-31 NOTE — Progress Notes (Signed)
MRCP results revealed no choledocholithiasis, but evidence of cholangitis with periportal edema.  We will proceed with laparoscopic cholecystectomy with intraoperative cholangiograms, liver biopsy tomorrow.  The risks and benefits of procedure including bleeding, infection, hepatobiliary injury, and the possibility of an open procedure were fully explained to the patient, who gave informed consent.  He has been written for a diet today.  N.p.o. after midnight. 

## 2020-12-31 NOTE — H&P (View-Only) (Signed)
MRCP results revealed no choledocholithiasis, but evidence of cholangitis with periportal edema.  We will proceed with laparoscopic cholecystectomy with intraoperative cholangiograms, liver biopsy tomorrow.  The risks and benefits of procedure including bleeding, infection, hepatobiliary injury, and the possibility of an open procedure were fully explained to the patient, who gave informed consent.  He has been written for a diet today.  N.p.o. after midnight.

## 2020-12-31 NOTE — Progress Notes (Signed)
Patient seen and examined.  Admitted after midnight secondary to abdominal pain, nausea and vomiting.  Patient reports symptom has been on and off for the last 35-month intermittent and mainly associated with food intake.  He has make changes in his lifestyle and diet without significant improvement.  Patient presents approximately 10 to 15 minutes after meals crampy, sharp and stabbing in nature, associated with nausea and vomiting.  Reports no fever.  The episode that brought her to the hospital just 9 remains present constantly which is waxing and waning.  In the ED work-up demonstrated visible gallstones with some inflammatory changes on CT scan, mild transaminitis and elevated WBCs.  General surgery has been consulted.  Please refer to H&P written by Dr. Carren Rang for further info/details.  Plan: -Continue IV Zosyn, IV fluids, antiemetics and analgesics. -Replete electrolytes as needed -diet to be advanced as per general surgery. -MRCP neg and per general surgery plan is for cholecystectomy tomorrow. -follow LFT's trend   Vassie Loll MD [phone]

## 2021-01-01 ENCOUNTER — Inpatient Hospital Stay (HOSPITAL_COMMUNITY): Payer: Medicaid Other | Admitting: Certified Registered"

## 2021-01-01 ENCOUNTER — Encounter (HOSPITAL_COMMUNITY)
Admission: EM | Disposition: A | Discharge status: Home / Self Care | Payer: Self-pay | Source: Home / Self Care | Attending: General Surgery

## 2021-01-01 ENCOUNTER — Inpatient Hospital Stay (HOSPITAL_COMMUNITY): Payer: Medicaid Other

## 2021-01-01 HISTORY — PX: CHOLECYSTECTOMY: SHX55

## 2021-01-01 HISTORY — PX: LIVER BIOPSY: SHX301

## 2021-01-01 LAB — SURGICAL PCR SCREEN
MRSA, PCR: NEGATIVE
Staphylococcus aureus: NEGATIVE

## 2021-01-01 LAB — HEPATIC FUNCTION PANEL
ALT: 305 U/L — ABNORMAL HIGH (ref 0–44)
AST: 215 U/L — ABNORMAL HIGH (ref 15–41)
Albumin: 3.9 g/dL (ref 3.5–5.0)
Alkaline Phosphatase: 91 U/L (ref 38–126)
Bilirubin, Direct: 3.8 mg/dL — ABNORMAL HIGH (ref 0.0–0.2)
Indirect Bilirubin: 3.2 mg/dL — ABNORMAL HIGH (ref 0.3–0.9)
Total Bilirubin: 7 mg/dL — ABNORMAL HIGH (ref 0.3–1.2)
Total Protein: 7.5 g/dL (ref 6.5–8.1)

## 2021-01-01 SURGERY — LAPAROSCOPIC CHOLECYSTECTOMY WITH INTRAOPERATIVE CHOLANGIOGRAM
Anesthesia: General | Site: Abdomen

## 2021-01-01 MED ORDER — PROPOFOL 10 MG/ML IV BOLUS
INTRAVENOUS | Status: AC
  Filled 2021-01-01: qty 20

## 2021-01-01 MED ORDER — FENTANYL CITRATE (PF) 250 MCG/5ML IJ SOLN
INTRAMUSCULAR | Status: AC
  Filled 2021-01-01: qty 5

## 2021-01-01 MED ORDER — FENTANYL CITRATE PF 50 MCG/ML IJ SOSY
25.0000 ug | PREFILLED_SYRINGE | INTRAMUSCULAR | Status: DC | PRN
  Administered 2021-01-01 (×2): 50 ug via INTRAVENOUS
  Filled 2021-01-01 (×2): qty 1

## 2021-01-01 MED ORDER — CHLORHEXIDINE GLUCONATE CLOTH 2 % EX PADS
6.0000 | MEDICATED_PAD | Freq: Once | CUTANEOUS | Status: AC
  Administered 2021-01-01: 6 via TOPICAL

## 2021-01-01 MED ORDER — ONDANSETRON HCL 4 MG/2ML IJ SOLN: 4.0000 mg | Freq: Once | INTRAMUSCULAR | Status: DC | PRN

## 2021-01-01 MED ORDER — DEXMEDETOMIDINE (PRECEDEX) IN NS 20 MCG/5ML (4 MCG/ML) IV SYRINGE
PREFILLED_SYRINGE | INTRAVENOUS | Status: DC | PRN
  Administered 2021-01-01: 20 ug via INTRAVENOUS

## 2021-01-01 MED ORDER — LACTATED RINGERS IV SOLN: INTRAVENOUS | Status: DC | PRN

## 2021-01-01 MED ORDER — 0.9 % SODIUM CHLORIDE (POUR BTL) OPTIME
TOPICAL | Status: DC | PRN
  Administered 2021-01-01: 1000 mL

## 2021-01-01 MED ORDER — LIDOCAINE HCL (PF) 2 % IJ SOLN
INTRAMUSCULAR | Status: AC
  Filled 2021-01-01: qty 5

## 2021-01-01 MED ORDER — DEXAMETHASONE SODIUM PHOSPHATE 10 MG/ML IJ SOLN
INTRAMUSCULAR | Status: DC | PRN
  Administered 2021-01-01: 5 mg via INTRAVENOUS

## 2021-01-01 MED ORDER — SODIUM CHLORIDE 0.9 % IV SOLN
INTRAVENOUS | Status: DC | PRN
  Administered 2021-01-01: 15 mL

## 2021-01-01 MED ORDER — SUCCINYLCHOLINE CHLORIDE 200 MG/10ML IV SOSY
PREFILLED_SYRINGE | INTRAVENOUS | Status: AC
  Filled 2021-01-01: qty 10

## 2021-01-01 MED ORDER — LACTATED RINGERS IV SOLN: INTRAVENOUS | Status: DC

## 2021-01-01 MED ORDER — ROCURONIUM BROMIDE 100 MG/10ML IV SOLN
INTRAVENOUS | Status: DC | PRN
  Administered 2021-01-01: 10 mg via INTRAVENOUS
  Administered 2021-01-01: 50 mg via INTRAVENOUS

## 2021-01-01 MED ORDER — KETOROLAC TROMETHAMINE 30 MG/ML IJ SOLN
INTRAMUSCULAR | Status: AC
  Filled 2021-01-01: qty 1

## 2021-01-01 MED ORDER — MIDAZOLAM HCL 2 MG/2ML IJ SOLN
INTRAMUSCULAR | Status: DC | PRN
  Administered 2021-01-01: 4 mg via INTRAVENOUS

## 2021-01-01 MED ORDER — KETOROLAC TROMETHAMINE 30 MG/ML IJ SOLN
30.0000 mg | Freq: Four times a day (QID) | INTRAMUSCULAR | Status: AC
  Administered 2021-01-01: 30 mg via INTRAVENOUS

## 2021-01-01 MED ORDER — LIDOCAINE HCL (CARDIAC) PF 50 MG/5ML IV SOSY
PREFILLED_SYRINGE | INTRAVENOUS | Status: DC | PRN
  Administered 2021-01-01: 100 mg via INTRAVENOUS

## 2021-01-01 MED ORDER — SUCCINYLCHOLINE CHLORIDE 200 MG/10ML IV SOSY
PREFILLED_SYRINGE | INTRAVENOUS | Status: DC | PRN
  Administered 2021-01-01: 200 mg via INTRAVENOUS

## 2021-01-01 MED ORDER — CHLORHEXIDINE GLUCONATE 0.12 % MT SOLN: 15.0000 mL | Freq: Once | OROMUCOSAL | Status: DC

## 2021-01-01 MED ORDER — ROCURONIUM BROMIDE 10 MG/ML (PF) SYRINGE
PREFILLED_SYRINGE | INTRAVENOUS | Status: AC
  Filled 2021-01-01: qty 10

## 2021-01-01 MED ORDER — MIDAZOLAM HCL 2 MG/2ML IJ SOLN
INTRAMUSCULAR | Status: AC
  Filled 2021-01-01: qty 2

## 2021-01-01 MED ORDER — DEXAMETHASONE SODIUM PHOSPHATE 10 MG/ML IJ SOLN
INTRAMUSCULAR | Status: AC
  Filled 2021-01-01: qty 1

## 2021-01-01 MED ORDER — BUPIVACAINE LIPOSOME 1.3 % IJ SUSP
INTRAMUSCULAR | Status: DC | PRN
  Administered 2021-01-01: 20 mL

## 2021-01-01 MED ORDER — DEXMEDETOMIDINE (PRECEDEX) IN NS 20 MCG/5ML (4 MCG/ML) IV SYRINGE
PREFILLED_SYRINGE | INTRAVENOUS | Status: AC
  Filled 2021-01-01: qty 5

## 2021-01-01 MED ORDER — KETOROLAC TROMETHAMINE 30 MG/ML IJ SOLN: 30.0000 mg | Freq: Four times a day (QID) | INTRAMUSCULAR | Status: DC | PRN

## 2021-01-01 MED ORDER — ORAL CARE MOUTH RINSE: 15.0000 mL | Freq: Once | OROMUCOSAL | Status: DC

## 2021-01-01 MED ORDER — SUGAMMADEX SODIUM 500 MG/5ML IV SOLN
INTRAVENOUS | Status: DC | PRN
  Administered 2021-01-01: 200 mg via INTRAVENOUS

## 2021-01-01 MED ORDER — SIMETHICONE 80 MG PO CHEW
40.0000 mg | CHEWABLE_TABLET | Freq: Four times a day (QID) | ORAL | Status: DC | PRN
  Filled 2021-01-01: qty 1

## 2021-01-01 MED ORDER — FENTANYL CITRATE (PF) 100 MCG/2ML IJ SOLN
INTRAMUSCULAR | Status: DC | PRN
  Administered 2021-01-01: 50 ug via INTRAVENOUS
  Administered 2021-01-01: 150 ug via INTRAVENOUS
  Administered 2021-01-01: 50 ug via INTRAVENOUS

## 2021-01-01 MED ORDER — ONDANSETRON HCL 4 MG/2ML IJ SOLN
INTRAMUSCULAR | Status: AC
  Filled 2021-01-01: qty 2

## 2021-01-01 MED ORDER — PROPOFOL 10 MG/ML IV BOLUS
INTRAVENOUS | Status: DC | PRN
  Administered 2021-01-01: 200 mg via INTRAVENOUS

## 2021-01-01 MED ORDER — BUPIVACAINE LIPOSOME 1.3 % IJ SUSP
INTRAMUSCULAR | Status: AC
  Filled 2021-01-01: qty 20

## 2021-01-01 SURGICAL SUPPLY — 50 items
ADH SKN CLS APL DERMABOND .7 (GAUZE/BANDAGES/DRESSINGS) ×1
APL PRP STRL LF DISP 70% ISPRP (MISCELLANEOUS) ×1
APL SRG 38 LTWT LNG FL B (MISCELLANEOUS)
APPLICATOR ARISTA FLEXITIP XL (MISCELLANEOUS) IMPLANT
APPLIER CLIP ROT 10 11.4 M/L (STAPLE) ×2
APR CLP MED LRG 11.4X10 (STAPLE) ×1
BAG RETRIEVAL 10 (BASKET) ×1
CATH CHOLANGIOGRAM 4.5FR (CATHETERS) ×2 IMPLANT
CHLORAPREP W/TINT 26 (MISCELLANEOUS) ×2 IMPLANT
CLIP APPLIE ROT 10 11.4 M/L (STAPLE) ×1 IMPLANT
CLOTH BEACON ORANGE TIMEOUT ST (SAFETY) ×2 IMPLANT
COVER LIGHT HANDLE STERIS (MISCELLANEOUS) ×4 IMPLANT
DERMABOND ADVANCED (GAUZE/BANDAGES/DRESSINGS) ×1
DERMABOND ADVANCED .7 DNX12 (GAUZE/BANDAGES/DRESSINGS) ×1 IMPLANT
DRAPE C-ARM FOLDED MOBILE STRL (DRAPES) ×2 IMPLANT
ELECT REM PT RETURN 9FT ADLT (ELECTROSURGICAL) ×2
ELECTRODE REM PT RTRN 9FT ADLT (ELECTROSURGICAL) ×1 IMPLANT
GLOVE SURG POLYISO LF SZ7.5 (GLOVE) ×2 IMPLANT
GLOVE SURG UNDER POLY LF SZ7 (GLOVE) ×6 IMPLANT
GOWN STRL REUS W/TWL LRG LVL3 (GOWN DISPOSABLE) ×6 IMPLANT
HEMOSTAT ARISTA ABSORB 3G PWDR (HEMOSTASIS) IMPLANT
HEMOSTAT SNOW SURGICEL 2X4 (HEMOSTASIS) ×2 IMPLANT
INST SET LAPROSCOPIC AP (KITS) ×2 IMPLANT
KIT TURNOVER KIT A (KITS) ×2 IMPLANT
MANIFOLD NEPTUNE II (INSTRUMENTS) ×2 IMPLANT
NDL BIOPSY 14X6 SOFT TISS (NEEDLE) IMPLANT
NDL HYPO 18GX1.5 BLUNT FILL (NEEDLE) ×1 IMPLANT
NDL HYPO 21X1.5 SAFETY (NEEDLE) ×1 IMPLANT
NEEDLE BIOPSY 14X6 SOFT TISS (NEEDLE) ×2 IMPLANT
NEEDLE HYPO 18GX1.5 BLUNT FILL (NEEDLE) ×2 IMPLANT
NEEDLE HYPO 21X1.5 SAFETY (NEEDLE) ×2 IMPLANT
NEEDLE INSUFFLATION 120MM (ENDOMECHANICALS) ×2 IMPLANT
NS IRRIG 1000ML POUR BTL (IV SOLUTION) ×2 IMPLANT
PACK LAP CHOLE LZT030E (CUSTOM PROCEDURE TRAY) ×2 IMPLANT
PAD ARMBOARD 7.5X6 YLW CONV (MISCELLANEOUS) ×2 IMPLANT
SET BASIN LINEN APH (SET/KITS/TRAYS/PACK) ×2 IMPLANT
SET TUBE IRRIG SUCTION NO TIP (IRRIGATION / IRRIGATOR) IMPLANT
SET TUBE SMOKE EVAC HIGH FLOW (TUBING) ×2 IMPLANT
SLEEVE ENDOPATH XCEL 5M (ENDOMECHANICALS) ×2 IMPLANT
SUT MNCRL AB 4-0 PS2 18 (SUTURE) ×4 IMPLANT
SUT VICRYL 0 UR6 27IN ABS (SUTURE) ×2 IMPLANT
SYR 20ML LL LF (SYRINGE) ×4 IMPLANT
SYR 30ML LL (SYRINGE) ×2 IMPLANT
SYS BAG RETRIEVAL 10MM (BASKET) ×1
SYSTEM BAG RETRIEVAL 10MM (BASKET) ×1 IMPLANT
TROCAR ENDO BLADELESS 11MM (ENDOMECHANICALS) ×2 IMPLANT
TROCAR XCEL NON-BLD 5MMX100MML (ENDOMECHANICALS) ×2 IMPLANT
TROCAR XCEL UNIV SLVE 11M 100M (ENDOMECHANICALS) ×2 IMPLANT
TUBE CONNECTING 12X1/4 (SUCTIONS) ×2 IMPLANT
WARMER LAPAROSCOPE (MISCELLANEOUS) ×2 IMPLANT

## 2021-01-01 NOTE — Transfer of Care (Signed)
Immediate Anesthesia Transfer of Care Note  Patient: Alexander Villanueva  Procedure(s) Performed: LAPAROSCOPIC CHOLECYSTECTOMY WITH INTRAOPERATIVE CHOLANGIOGRAM (Abdomen) LIVER BIOPSY (Abdomen)  Patient Location: PACU  Anesthesia Type:General  Level of Consciousness: awake, alert , oriented and patient cooperative  Airway & Oxygen Therapy: Patient Spontanous Breathing and Patient connected to nasal cannula oxygen  Post-op Assessment: Report given to RN, Post -op Vital signs reviewed and stable and Patient moving all extremities X 4  Post vital signs: Reviewed and stable  Last Vitals:  Vitals Value Taken Time  BP 125/66 01/01/21 1256  Temp    Pulse 90 01/01/21 1258  Resp 25 01/01/21 1258  SpO2 96 % 01/01/21 1258  Vitals shown include unvalidated device data.  Last Pain:  Vitals:   01/01/21 1045  TempSrc: Oral  PainSc: 0-No pain      Patients Stated Pain Goal: 5 (01/01/21 1045)  Complications: No notable events documented.

## 2021-01-01 NOTE — Progress Notes (Signed)
Patient with complaints of itchy, raised rash on left wrist that appeared about 30 minutes to 1 hour after using CHG wipes. MD notified. Per Lovell Sheehan MD, no more CHG wipes.

## 2021-01-01 NOTE — Progress Notes (Signed)
Patient taken down for surgery procedure.

## 2021-01-01 NOTE — Interval H&P Note (Signed)
History and Physical Interval Note:  01/01/2021 10:53 AM  Alexander Villanueva  has presented today for surgery, with the diagnosis of acute cholecystitis.  The various methods of treatment have been discussed with the patient and family. After consideration of risks, benefits and other options for treatment, the patient has consented to  Procedure(s): LAPAROSCOPIC CHOLECYSTECTOMY WITH INTRAOPERATIVE CHOLANGIOGRAM (N/A) as a surgical intervention.  The patient's history has been reviewed, patient examined, no change in status, stable for surgery.  I have reviewed the patient's chart and labs.  Questions were answered to the patient's satisfaction.     Franky Macho

## 2021-01-01 NOTE — Anesthesia Postprocedure Evaluation (Signed)
Anesthesia Post Note  Patient: Alexander Villanueva  Procedure(s) Performed: LAPAROSCOPIC CHOLECYSTECTOMY WITH INTRAOPERATIVE CHOLANGIOGRAM (Abdomen) LIVER BIOPSY (Abdomen)  Patient location during evaluation: Phase II Anesthesia Type: General Level of consciousness: awake Pain management: pain level controlled Vital Signs Assessment: post-procedure vital signs reviewed and stable Respiratory status: spontaneous breathing and respiratory function stable Cardiovascular status: blood pressure returned to baseline and stable Postop Assessment: no headache and no apparent nausea or vomiting Anesthetic complications: no Comments: Late entry   No notable events documented.   Last Vitals:  Vitals:   01/01/21 1345 01/01/21 1411  BP: 123/65 116/60  Pulse: 92 84  Resp: 17 20  Temp: 36.7 C   SpO2: 96% 90%    Last Pain:  Vitals:   01/01/21 1411  TempSrc:   PainSc: 5                  Windell Norfolk

## 2021-01-01 NOTE — Anesthesia Preprocedure Evaluation (Signed)
Anesthesia Evaluation  Patient identified by MRN, date of birth, ID band Patient awake    Reviewed: Allergy & Precautions, H&P , NPO status , Patient's Chart, lab work & pertinent test results, reviewed documented beta blocker date and time   Airway Mallampati: II  TM Distance: >3 FB Neck ROM: full    Dental no notable dental hx.    Pulmonary asthma , Current Smoker,    Pulmonary exam normal breath sounds clear to auscultation       Cardiovascular Exercise Tolerance: Good negative cardio ROS   Rhythm:regular Rate:Normal     Neuro/Psych negative neurological ROS  negative psych ROS   GI/Hepatic negative GI ROS, Neg liver ROS,   Endo/Other  negative endocrine ROS  Renal/GU negative Renal ROS  negative genitourinary   Musculoskeletal   Abdominal   Peds  Hematology negative hematology ROS (+)   Anesthesia Other Findings   Reproductive/Obstetrics negative OB ROS                             Anesthesia Physical Anesthesia Plan  ASA: 2  Anesthesia Plan: General and General ETT   Post-op Pain Management:    Induction:   PONV Risk Score and Plan: Ondansetron  Airway Management Planned:   Additional Equipment:   Intra-op Plan:   Post-operative Plan:   Informed Consent: I have reviewed the patients History and Physical, chart, labs and discussed the procedure including the risks, benefits and alternatives for the proposed anesthesia with the patient or authorized representative who has indicated his/her understanding and acceptance.     Dental Advisory Given  Plan Discussed with: CRNA  Anesthesia Plan Comments:         Anesthesia Quick Evaluation

## 2021-01-01 NOTE — Anesthesia Procedure Notes (Signed)
Procedure Name: Intubation Date/Time: 01/01/2021 12:05 PM Performed by: Brynda Peon, CRNA Pre-anesthesia Checklist: Patient identified, Emergency Drugs available, Suction available, Patient being monitored and Timeout performed Patient Re-evaluated:Patient Re-evaluated prior to induction Oxygen Delivery Method: Circle system utilized Preoxygenation: Pre-oxygenation with 100% oxygen Induction Type: IV induction Laryngoscope Size: Miller and 2 Grade View: Grade I Tube type: Oral Tube size: 7.5 mm Number of attempts: 1 Airway Equipment and Method: Stylet Placement Confirmation: ETT inserted through vocal cords under direct vision, positive ETCO2, CO2 detector and breath sounds checked- equal and bilateral Secured at: 23 cm Tube secured with: Tape Dental Injury: Teeth and Oropharynx as per pre-operative assessment

## 2021-01-01 NOTE — Op Note (Signed)
Patient:  Alexander Villanueva  DOB:  11/20/1995  MRN:  [id number]   Preop Diagnosis: Cholecystitis, cholelithiasis, transaminitis  Postop Diagnosis: Same  Procedure: Laparoscopic cholecystectomy with intraoperative cholangiograms, liver biopsy  Surgeon: Franky Macho, MD  Assistant: Algis Greenhouse, MD  Anes: General endotracheal  Indications: Patient is a 25 year old white male who presents with cholecystitis secondary with cholelithiasis as well as transaminitis with jaundice.  Preoperative MRCP was negative.  Today, his total bilirubin has increased.  He is now coming to the operating room for laparoscopic cholecystectomy with intraoperative cholangiograms as well as a liver biopsy.  The risks and benefits of all the procedures including bleeding, infection, hepatobiliary injury, and the possibility of an open procedure were fully explained to the patient, who gave informed consent.  Procedure note: The patient was placed in the supine position.  After induction of general endotracheal anesthesia, the abdomen was prepped and draped using the usual sterile technique with ChloraPrep.  Surgical site confirmation was performed.  A supraumbilical incision was made down to the fascia.  The Veress needle was introduced into the abdominal cavity and confirmation of placement was done using the saline drop test.  The abdomen was then insufflated to 15 mmHg pressure.  An 11 mm trocar was introduced into the abdominal cavity under direct visualization without difficulty.  The patient was placed in reverse Trendelenburg position and an additional 11 mm trocar was placed in the epigastric region and 5 mm trochars were placed the right upper quadrant and right flank regions.  The liver grossly appeared within normal limits.  A Tru-Cut needle biopsy of the right lobe of the liver was performed and sent to pathology for further examination.  The puncture site was cauterized using Bovie electrocautery.  The  gallbladder was retracted in a dynamic fashion in order to provide a critical view of the triangle of Calot.  The cystic duct was first identified.  Its junction to the infundibulum was fully identified.  A single Endo Clip was placed proximally on the cystic duct.  An incision was made into the cystic duct and a cholangiogram was performed.  Under digital fluoroscopy, the hepatobiliary tree was visualized.  There were no filling defects.  The dye flowed freely into the duodenum.  No filling defects were noted.  The system was flushed with saline and the cholangiocatheter removed.  Multiple endoclips were placed distally on the cystic duct and the cystic duct was divided.  The cystic artery was likewise ligated and divided.  The gallbladder was freed away from the gallbladder fossa using Bovie electrocautery.  The gallbladder was delivered through the epigastric trocar site using an Endo Catch bag.  The gallbladder fossa was inspected and no abnormal bleeding or bile leakage was noted.  Surgicel was placed in the gallbladder fossa.  All fluid and air were then evacuated from the abdominal cavity prior to the removal of the trochars.  All wounds were irrigated with normal saline.  All wounds were injected with Exparel.  The epigastric fascia was reapproximated using an 0 Vicryl interrupted suture.  All skin incisions were closed using a 4-0 Monocryl subcuticular suture.  Dermabond was applied.  All tape and needle counts were correct at the end of the procedure.  The patient was extubated in the operating room and transferred to PACU in stable condition.  Complications: None  EBL: Minimal  Specimen: Gallbladder

## 2021-01-02 ENCOUNTER — Encounter (HOSPITAL_COMMUNITY): Payer: Self-pay | Admitting: General Surgery

## 2021-01-02 LAB — CBC
HCT: 38.8 % — ABNORMAL LOW (ref 39.0–52.0)
Hemoglobin: 12.4 g/dL — ABNORMAL LOW (ref 13.0–17.0)
MCH: 26.8 pg (ref 26.0–34.0)
MCHC: 32 g/dL (ref 30.0–36.0)
MCV: 83.8 fL (ref 80.0–100.0)
Platelets: 281 10*3/uL (ref 150–400)
RBC: 4.63 MIL/uL (ref 4.22–5.81)
RDW: 14.6 % (ref 11.5–15.5)
WBC: 16.8 10*3/uL — ABNORMAL HIGH (ref 4.0–10.5)
nRBC: 0 % (ref 0.0–0.2)

## 2021-01-02 LAB — COMPREHENSIVE METABOLIC PANEL
ALT: 192 U/L — ABNORMAL HIGH (ref 0–44)
AST: 71 U/L — ABNORMAL HIGH (ref 15–41)
Albumin: 3.3 g/dL — ABNORMAL LOW (ref 3.5–5.0)
Alkaline Phosphatase: 74 U/L (ref 38–126)
Anion gap: 5 (ref 5–15)
BUN: 7 mg/dL (ref 6–20)
CO2: 25 mmol/L (ref 22–32)
Calcium: 8.6 mg/dL — ABNORMAL LOW (ref 8.9–10.3)
Chloride: 105 mmol/L (ref 98–111)
Creatinine, Ser: 0.71 mg/dL (ref 0.61–1.24)
GFR, Estimated: >60 mL/min (ref >60)
Glucose, Bld: 151 mg/dL — ABNORMAL HIGH (ref 70–99)
Potassium: 3.9 mmol/L (ref 3.5–5.1)
Sodium: 135 mmol/L (ref 135–145)
Total Bilirubin: 1.3 mg/dL — ABNORMAL HIGH (ref 0.3–1.2)
Total Protein: 6.5 g/dL (ref 6.5–8.1)

## 2021-01-02 LAB — SURGICAL PATHOLOGY

## 2021-01-02 MED ORDER — OXYCODONE HCL 5 MG PO TABS: 5.0000 mg | ORAL_TABLET | ORAL | 0 refills | Status: DC | PRN

## 2021-01-02 NOTE — Discharge Summary (Signed)
Physician Discharge Summary  Patient ID: Alexander Villanueva MRN: [id number] DOB/AGE: 24/29/1997 24 y.o.  Admit date: 12/30/2020 Discharge date: 01/02/2021  Admission Diagnoses: Cholecystitis, cholelithiasis, transaminitis, jaundice  Discharge Diagnoses: Same Active Problems:   Cholecystitis   Transaminitis   Leukocytosis   Cholelithiasis   Discharged Condition: good  Hospital Course: Patient is a 25 year old white male with a longstanding history of mild transaminitis who presented to the emergency room with worsening right upper quadrant abdominal pain.  Ultrasound the gallbladder revealed cholelithiasis with.  Portal edema of a normal common bile duct.  His liver enzyme tests were noted to be significantly elevated with a total bilirubin around 5.  The direct bilirubin was also elevated.  An MRCP was done which revealed no choledocholithiasis.  He subsequently underwent laparoscopic cholecystectomy with intraoperative cholangiograms and the liver biopsy on 01/01/2021.  He tolerated the procedure well.  His postoperative course has been unremarkable.  His diet was advanced without difficulty.  Follow-up liver enzyme tests reveal normalization of his total bilirubin with resolving transaminitis.  He is being discharged home on 01/02/2021 in good and improving condition.  Treatments: surgery: Laparoscopic cholecystectomy with intraoperative cholangiograms, liver biopsy on 01/01/2021  Discharge Exam: Blood pressure 111/68, pulse 92, temperature 98.5 F (36.9 C), resp. rate 17, height 5\' 7"  (1.702 m), weight 126.4 kg, SpO2 97 %. General appearance: alert, cooperative, and no distress Resp: clear to auscultation bilaterally Cardio: regular rate and rhythm, S1, S2 normal, no murmur, click, rub or gallop GI: Soft, incisions healing well.  Mild incisional tenderness.  Disposition: Discharge disposition: 01-Home or Self Care       Discharge Instructions     Diet - low sodium heart healthy    Complete by: As directed    Increase activity slowly   Complete by: As directed       Allergies as of 01/02/2021   No Known Allergies      Medication List     STOP taking these medications    potassium chloride SA 20 MEQ tablet Commonly known as: KLOR-CON   traMADol 50 MG tablet Commonly known as: ULTRAM       TAKE these medications    oxyCODONE 5 MG immediate release tablet Commonly known as: Roxicodone Take 1 tablet (5 mg total) by mouth every 4 (four) hours as needed.        Follow-up Information     03/04/2021, MD. Schedule an appointment as soon as possible for a visit on 01/16/2021.   Specialty: General Surgery Contact information: [id number] [place] [phone]                 Signed: [phone] 01/02/2021, 9:16 AM

## 2021-01-16 ENCOUNTER — Encounter: Payer: Self-pay | Admitting: General Surgery

## 2021-01-16 ENCOUNTER — Other Ambulatory Visit: Payer: Self-pay

## 2021-01-16 ENCOUNTER — Ambulatory Visit (INDEPENDENT_AMBULATORY_CARE_PROVIDER_SITE_OTHER): Payer: Medicaid Other | Admitting: General Surgery

## 2021-01-16 VITALS — BP 113/73 | HR 71 | Temp 98.5°F | Resp 16 | Ht 67.0 in | Wt 280.0 lb

## 2021-01-16 DIAGNOSIS — Z09 Encounter for follow-up examination after completed treatment for conditions other than malignant neoplasm: Secondary | ICD-10-CM

## 2021-01-16 NOTE — Progress Notes (Signed)
Subjective:     Alexander Villanueva  Patient doing well.  He has no complaints status post laparoscopic cholecystectomy, liver biopsy. Objective:    BP 113/73   Pulse 71   Temp 98.5 F (36.9 C) (Other (Comment))   Resp 16   Ht 5\' 7"  (1.702 m)   Wt 280 lb (127 kg)   SpO2 98%   BMI 43.85 kg/m   General:  alert, cooperative, and no distress  Abdomen soft, incisions healing well. Final pathology reviewed with patient.     Assessment:    Doing well postoperatively.    Plan:   Follow-up here as needed.  May resume normal activity.

## 2021-03-18 ENCOUNTER — Other Ambulatory Visit: Payer: Self-pay

## 2021-03-18 ENCOUNTER — Emergency Department (HOSPITAL_COMMUNITY)
Admission: EM | Admit: 2021-03-18 | Discharge: 2021-03-18 | Disposition: A | Discharge status: Home / Self Care | Payer: Medicaid Other | Attending: Emergency Medicine | Admitting: Emergency Medicine

## 2021-03-18 ENCOUNTER — Encounter (HOSPITAL_COMMUNITY): Payer: Self-pay

## 2021-03-18 DIAGNOSIS — W25XXXA Contact with sharp glass, initial encounter: Secondary | ICD-10-CM | POA: Diagnosis not present

## 2021-03-18 DIAGNOSIS — F17210 Nicotine dependence, cigarettes, uncomplicated: Secondary | ICD-10-CM | POA: Diagnosis not present

## 2021-03-18 DIAGNOSIS — S61212A Laceration without foreign body of right middle finger without damage to nail, initial encounter: Secondary | ICD-10-CM | POA: Diagnosis present

## 2021-03-18 DIAGNOSIS — S61214A Laceration without foreign body of right ring finger without damage to nail, initial encounter: Secondary | ICD-10-CM | POA: Diagnosis not present

## 2021-03-18 DIAGNOSIS — Z23 Encounter for immunization: Secondary | ICD-10-CM | POA: Insufficient documentation

## 2021-03-18 DIAGNOSIS — J45909 Unspecified asthma, uncomplicated: Secondary | ICD-10-CM | POA: Insufficient documentation

## 2021-03-18 MED ORDER — LIDOCAINE HCL (PF) 1 % IJ SOLN
30.0000 mL | Freq: Once | INTRAMUSCULAR | Status: AC
  Administered 2021-03-18: 30 mL
  Filled 2021-03-18: qty 30

## 2021-03-18 MED ORDER — TETANUS-DIPHTH-ACELL PERTUSSIS 5-2.5-18.5 LF-MCG/0.5 IM SUSY
0.5000 mL | PREFILLED_SYRINGE | Freq: Once | INTRAMUSCULAR | Status: AC
  Administered 2021-03-18: 0.5 mL via INTRAMUSCULAR
  Filled 2021-03-18: qty 0.5

## 2021-03-18 NOTE — ED Triage Notes (Signed)
Pt states he hit his right hand on a glass vase, cutting multiple fingers. Bleeding controlled at this time

## 2021-03-18 NOTE — ED Notes (Signed)
Gave d/c papers and dressing supplies. All questions answered . Ambulatory to lobby.

## 2021-03-18 NOTE — ED Provider Notes (Signed)
[facility] EMERGENCY DEPARTMENT Provider Note   CSN: [id number] Arrival date & time: 03/18/21  0125     History Chief Complaint  Patient presents with   Laceration    Alexander Villanueva is a 25 y.o. male.  The history is provided by the patient.  Laceration He has history of asthma and comes in having cut several fingers of his right hand on a vase that broke.  He does not know when his last tetanus immunization was.  He denies other injury.   Past Medical History:  Diagnosis Date   Asthma    Concussion     Patient Active Problem List   Diagnosis Date Noted   Transaminitis 12/31/2020   Leukocytosis 12/31/2020   Cholelithiasis    Cholecystitis 12/30/2020    Past Surgical History:  Procedure Laterality Date   CHOLECYSTECTOMY N/A 01/01/2021   Procedure: LAPAROSCOPIC CHOLECYSTECTOMY WITH INTRAOPERATIVE CHOLANGIOGRAM;  Surgeon: Franky Macho, MD;  Location: [facility];  Service: General;  Laterality: N/A;   LIVER BIOPSY N/A 01/01/2021   Procedure: LIVER BIOPSY;  Surgeon: Franky Macho, MD;  Location: [facility];  Service: General;  Laterality: N/A;       No family history on file.  Social History   Tobacco Use   Smoking status: Some Days    Packs/day: 0.50    Types: Cigarettes   Smokeless tobacco: Never  Vaping Use   Vaping Use: Former  Substance Use Topics   Alcohol use: Yes    Alcohol/week: 1.0 standard drink    Types: 1 Cans of beer per week    Comment: occasional   Drug use: No    Home Medications Prior to Admission medications   Medication Sig Start Date End Date Taking? Authorizing Provider  oxyCODONE (ROXICODONE) 5 MG immediate release tablet Take 1 tablet (5 mg total) by mouth every 4 (four) hours as needed. Patient not taking: Reported on 01/16/2021 01/02/21 01/02/22  Franky Macho, MD    Allergies    Patient has no known allergies.  Review of Systems   Review of Systems  All other systems reviewed and are negative.  Physical Exam Updated Vital  Signs BP 140/88   Pulse 79   Temp 97.8 F (36.6 C)   Resp 19   Ht 5\' 7"  (1.702 m)   Wt 124.7 kg   SpO2 100%   BMI 43.07 kg/m   Physical Exam Vitals and nursing note reviewed.  25 year old male, resting comfortably and in no acute distress. Vital signs are normal. Oxygen saturation is 100%, which is normal. Head is normocephalic and atraumatic. PERRLA, EOMI. Oropharynx is clear. Neck is nontender and supple without adenopathy or JVD. Back is nontender and there is no CVA tenderness. Lungs are clear without rales, wheezes, or rhonchi. Chest is nontender. Heart has regular rate and rhythm without murmur. Abdomen is soft, flat, nontender. Extremities: Lacerations are present on the right hand on the pad of the right third finger, middle phalanx of the right fourth finger.  There is minor epidermal skin avulsion on the dorsum of the right second MCP joint.  Distal neurovascular exam is normal with prompt capillary refill and normal sensation in all fingers.  There is normal tendon function. Skin is warm and dry without rash. Neurologic: Mental status is normal, cranial nerves are intact, moves all extremities equally.  ED Results / Procedures / Treatments    Procedures .30Laceration Repair  Date/Time: 03/18/2021 5:06 AM Performed by: 03/20/2021, MD Authorized by: Dione Booze,  MD   Consent:    Consent obtained:  Verbal   Consent given by:  Patient   Risks, benefits, and alternatives were discussed: yes     Risks discussed:  Infection, pain, retained foreign body and poor wound healing   Alternatives discussed:  No treatment Universal protocol:    Procedure explained and questions answered to patient or proxy's satisfaction: yes     Relevant documents present and verified: yes     Required blood products, implants, devices, and special equipment available: yes     Site/side marked: yes     Immediately prior to procedure, a time out was called: yes     Patient identity  confirmed:  Verbally with patient and arm band Anesthesia:    Anesthesia method:  Local infiltration   Local anesthetic:  Lidocaine 1% w/o epi Laceration details:    Location:  Finger   Finger location:  R long finger   Length (cm):  2.5   Depth (mm):  3 Pre-procedure details:    Preparation:  Patient was prepped and draped in usual sterile fashion Exploration:    Limited defect created (wound extended): no     Wound exploration: entire depth of wound visualized     Wound extent: no foreign bodies/material noted and no tendon damage noted     Contaminated: no   Treatment:    Area cleansed with:  Saline   Amount of cleaning:  Standard   Debridement:  None   Undermining:  None   Scar revision: no   Skin repair:    Repair method:  Sutures   Suture size:  4-0   Suture material:  Prolene   Suture technique:  Simple interrupted   Number of sutures:  5 Approximation:    Approximation:  Close Repair type:    Repair type:  Simple Post-procedure details:    Dressing:  Antibiotic ointment and sterile dressing   Procedure completion:  Tolerated well, no immediate complications .Marland KitchenLaceration Repair  Date/Time: 03/18/2021 5:08 AM Performed by: Dione Booze, MD Authorized by: Dione Booze, MD   Consent:    Consent obtained:  Verbal   Consent given by:  Patient   Risks discussed:  Infection, retained foreign body and poor cosmetic result   Alternatives discussed:  No treatment Universal protocol:    Procedure explained and questions answered to patient or proxy's satisfaction: yes     Relevant documents present and verified: yes     Required blood products, implants, devices, and special equipment available: yes     Site/side marked: yes     Immediately prior to procedure, a time out was called: yes     Patient identity confirmed:  Verbally with patient and arm band Anesthesia:    Anesthesia method:  Local infiltration   Local anesthetic:  Lidocaine 1% w/o epi Laceration  details:    Location:  Finger   Finger location:  R ring finger   Length (cm):  2   Depth (mm):  2 Pre-procedure details:    Preparation:  Patient was prepped and draped in usual sterile fashion Exploration:    Limited defect created (wound extended): no     Hemostasis achieved with:  Direct pressure   Wound exploration: entire depth of wound visualized     Wound extent: no foreign bodies/material noted and no tendon damage noted     Contaminated: no   Treatment:    Area cleansed with:  Saline   Amount of cleaning:  Standard  Debridement:  None   Undermining:  None   Scar revision: no   Skin repair:    Repair method:  Sutures   Suture size:  4-0   Suture material:  Prolene   Suture technique:  Simple interrupted   Number of sutures:  4 Approximation:    Approximation:  Close Repair type:    Repair type:  Simple Post-procedure details:    Dressing:  Antibiotic ointment and sterile dressing   Procedure completion:  Tolerated well, no immediate complications   Medications Ordered in ED Medications  lidocaine (PF) (XYLOCAINE) 1 % injection 30 mL (has no administration in time range)  Tdap (BOOSTRIX) injection 0.5 mL (has no administration in time range)    ED Course  I have reviewed the triage vital signs and the nursing notes.  MDM Rules/Calculators/A&P                         Lacerations of the right hand closed with sutures.  Tdap booster is given.  Old records are reviewed, and he has no relevant past visits.  Final Clinical Impression(s) / ED Diagnoses Final diagnoses:  Laceration of right middle finger without foreign body without damage to nail, initial encounter  Laceration of right ring finger without foreign body without damage to nail, initial encounter    Rx / DC Orders ED Discharge Orders     None        Dione Booze, MD 03/18/21 0510

## 2021-05-10 ENCOUNTER — Encounter: Payer: Self-pay | Admitting: Emergency Medicine

## 2021-05-10 ENCOUNTER — Other Ambulatory Visit: Payer: Self-pay

## 2021-05-10 ENCOUNTER — Ambulatory Visit
Admission: EM | Admit: 2021-05-10 | Discharge: 2021-05-10 | Disposition: A | Discharge status: Home / Self Care | Payer: Medicaid Other | Attending: Family Medicine | Admitting: Family Medicine

## 2021-05-10 DIAGNOSIS — R21 Rash and other nonspecific skin eruption: Secondary | ICD-10-CM

## 2021-05-10 MED ORDER — KETOCONAZOLE 200 MG PO TABS: 200.0000 mg | ORAL_TABLET | Freq: Every day | ORAL | 0 refills | Status: DC

## 2021-05-10 MED ORDER — KETOCONAZOLE 2 % EX SHAM: 1.0000 "application " | MEDICATED_SHAMPOO | CUTANEOUS | 0 refills | Status: DC

## 2021-05-10 MED ORDER — DOXYCYCLINE HYCLATE 100 MG PO CAPS: 100.0000 mg | ORAL_CAPSULE | Freq: Two times a day (BID) | ORAL | 0 refills | Status: DC

## 2021-05-10 NOTE — ED Triage Notes (Signed)
Pt reports "knots" in scalp for last several weeks. Pt reports symptoms started around the time that switched shampoo. Pt reports stop use and reports "knots" continue. Pt reports flare-up began again last night and have now extended to forehead. Pt denies any drainage from sites., fever, nausea.

## 2021-05-10 NOTE — ED Provider Notes (Signed)
[facility]    CSN: [id number] Arrival date & time: 05/10/21  0949      History   Chief Complaint Chief Complaint  Patient presents with   Rash    HPI Alexander Villanueva is a 26 y.o. male.   Patient presenting today with about 2 months of progressively worsening nodular regions and circular rashes present on scalp and now 2 large areas on forehead.  He states they are itchy, tender to the touch and seem to be worsening over time.  He initially noticed them after switching to a different shampoo but has since switched to an organic all-natural product and has not seen any improvement in symptoms.  He denies drainage, fevers, chills, body aches, insect bites or recent travel, new exposures or pets.   Past Medical History:  Diagnosis Date   Asthma    Concussion     Patient Active Problem List   Diagnosis Date Noted   Transaminitis 12/31/2020   Leukocytosis 12/31/2020   Cholelithiasis    Cholecystitis 12/30/2020    Past Surgical History:  Procedure Laterality Date   CHOLECYSTECTOMY N/A 01/01/2021   Procedure: LAPAROSCOPIC CHOLECYSTECTOMY WITH INTRAOPERATIVE CHOLANGIOGRAM;  Surgeon: Aviva Signs, MD;  Location: [facility];  Service: General;  Laterality: N/A;   LIVER BIOPSY N/A 01/01/2021   Procedure: LIVER BIOPSY;  Surgeon: Aviva Signs, MD;  Location: [facility];  Service: General;  Laterality: N/A;       Home Medications    Prior to Admission medications   Medication Sig Start Date End Date Taking? Authorizing Provider  doxycycline (VIBRAMYCIN) 100 MG capsule Take 1 capsule (100 mg total) by mouth 2 (two) times daily. 05/10/21  Yes Volney American, PA-C  ketoconazole (NIZORAL) 2 % shampoo Apply 1 application topically 2 (two) times a week. 05/12/21  Yes Volney American, PA-C  ketoconazole (NIZORAL) 200 MG tablet Take 1 tablet (200 mg total) by mouth daily. 05/10/21  Yes Volney American, PA-C    Family History No family history on file.  Social  History Social History   Tobacco Use   Smoking status: Some Days    Packs/day: 1.00    Types: Cigarettes   Smokeless tobacco: Never  Vaping Use   Vaping Use: Former  Substance Use Topics   Alcohol use: Yes    Alcohol/week: 1.0 standard drink    Types: 1 Cans of beer per week    Comment: occasional   Drug use: No     Allergies   Patient has no known allergies.   Review of Systems Review of Systems Per HPI  Physical Exam Triage Vital Signs ED Triage Vitals  Enc Vitals Group     BP 05/10/21 1109 129/77     Pulse Rate 05/10/21 1109 79     Resp 05/10/21 1109 18     Temp 05/10/21 1109 98.8 F (37.1 C)     Temp Source 05/10/21 1109 Oral     SpO2 05/10/21 1109 98 %     Weight 05/10/21 1110 275 lb (124.7 kg)     Height 05/10/21 1110 5\' 7"  (1.702 m)     Head Circumference --      Peak Flow --      Pain Score 05/10/21 1110 4     Pain Loc --      Pain Edu? --      Excl. in [place]? --    No data found.  Updated Vital Signs BP 129/77 (BP Location: Right Arm)  Pulse 79    Temp 98.8 F (37.1 C) (Oral)    Resp 18    Ht 5\' 7"  (1.702 m)    Wt 275 lb (124.7 kg)    SpO2 98%    BMI 43.07 kg/m   Visual Acuity Right Eye Distance:   Left Eye Distance:   Bilateral Distance:    Right Eye Near:   Left Eye Near:    Bilateral Near:     Physical Exam Vitals and nursing note reviewed.  Constitutional:      Appearance: Normal appearance.  HENT:     Head: Atraumatic.  Eyes:     Extraocular Movements: Extraocular movements intact.     Conjunctiva/sclera: Conjunctivae normal.  Cardiovascular:     Rate and Rhythm: Normal rate and regular rhythm.  Pulmonary:     Effort: Pulmonary effort is normal.     Breath sounds: Normal breath sounds.  Musculoskeletal:        General: Normal range of motion.     Cervical back: Normal range of motion and neck supple.  Skin:    General: Skin is warm and dry.     Findings: Erythema and rash present.     Comments: Erythematous raised border  annular rash, 2 large lesions on forehead and widespread across scalp with several nodular tender regions posterior scalp.  No obvious flaking, drainage, ulceration.  No obvious hair loss.  Neurological:     General: No focal deficit present.     Mental Status: He is oriented to person, place, and time.  Psychiatric:        Mood and Affect: Mood normal.        Thought Content: Thought content normal.        Judgment: Judgment normal.     UC Treatments / Results  Labs (all labs ordered are listed, but only abnormal results are displayed) Labs Reviewed - No data to display  EKG   Radiology No results found.  Procedures Procedures (including critical care time)  Medications Ordered in UC Medications - No data to display  Initial Impression / Assessment and Plan / UC Course  I have reviewed the triage vital signs and the nursing notes.  Pertinent labs & imaging results that were available during my care of the patient were reviewed by me and considered in my medical decision making (see chart for details).   Unclear etiology of rash, possibly Fungal so will cover with ketoconazole shampoo and tablets given progressive worsening of rash.  We will also treat with doxycycline in case infectious particularly given nodular lesions now appearing that are semi fluctuant.  Close dermatology and PCP follow-up strongly recommended.  Final Clinical Impressions(s) / UC Diagnoses   Final diagnoses:  Rash   Discharge Instructions   None    ED Prescriptions     Medication Sig Dispense Auth. Provider   ketoconazole (NIZORAL) 2 % shampoo Apply 1 application topically 2 (two) times a week. 120 mL Volney American, PA-C   ketoconazole (NIZORAL) 200 MG tablet Take 1 tablet (200 mg total) by mouth daily. 28 tablet Volney American, [place]   doxycycline (VIBRAMYCIN) 100 MG capsule Take 1 capsule (100 mg total) by mouth 2 (two) times daily. 20 capsule Volney American, [place]       PDMP not reviewed this encounter.   Volney American, [place] 05/10/21 1144

## 2021-07-26 ENCOUNTER — Encounter (HOSPITAL_COMMUNITY): Payer: Self-pay | Admitting: Emergency Medicine

## 2021-07-26 ENCOUNTER — Other Ambulatory Visit: Payer: Self-pay

## 2021-07-26 ENCOUNTER — Emergency Department (HOSPITAL_COMMUNITY): Payer: Medicaid Other

## 2021-07-26 ENCOUNTER — Emergency Department (HOSPITAL_COMMUNITY)
Admission: EM | Admit: 2021-07-26 | Discharge: 2021-07-26 | Disposition: A | Discharge status: Home / Self Care | Payer: Medicaid Other | Attending: Emergency Medicine | Admitting: Emergency Medicine

## 2021-07-26 DIAGNOSIS — Y92410 Unspecified street and highway as the place of occurrence of the external cause: Secondary | ICD-10-CM | POA: Diagnosis not present

## 2021-07-26 DIAGNOSIS — S20211A Contusion of right front wall of thorax, initial encounter: Secondary | ICD-10-CM | POA: Diagnosis not present

## 2021-07-26 DIAGNOSIS — S80211A Abrasion, right knee, initial encounter: Secondary | ICD-10-CM | POA: Diagnosis not present

## 2021-07-26 DIAGNOSIS — S299XXA Unspecified injury of thorax, initial encounter: Secondary | ICD-10-CM | POA: Diagnosis present

## 2021-07-26 MED ORDER — CYCLOBENZAPRINE HCL 10 MG PO TABS: 10.0000 mg | ORAL_TABLET | Freq: Two times a day (BID) | ORAL | 0 refills | Status: DC | PRN

## 2021-07-26 MED ORDER — IBUPROFEN 800 MG PO TABS
800.0000 mg | ORAL_TABLET | Freq: Three times a day (TID) | ORAL | 0 refills | Status: DC | PRN
Start: 2021-07-26 — End: 2021-12-03

## 2021-07-26 MED ORDER — ACETAMINOPHEN 325 MG PO TABS: 650.0000 mg | ORAL_TABLET | Freq: Four times a day (QID) | ORAL | 0 refills | Status: DC | PRN

## 2021-07-26 MED ORDER — ACETAMINOPHEN 500 MG PO TABS
1000.0000 mg | ORAL_TABLET | Freq: Once | ORAL | Status: AC
Start: 2021-07-26 — End: 2021-07-26
  Administered 2021-07-26: 1000 mg via ORAL
  Filled 2021-07-26: qty 2

## 2021-07-26 MED ORDER — IBUPROFEN 800 MG PO TABS
800.0000 mg | ORAL_TABLET | Freq: Once | ORAL | Status: AC
  Administered 2021-07-26: 800 mg via ORAL
  Filled 2021-07-26: qty 1
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
## 2021-07-26 NOTE — Discharge Instructions (Addendum)
? ?  Do not drive or operate machinery after using Flexeril.  This is a muscle relaxer that may make you drowsy.  It is most likely to be effective at night when you are going to bed. ?
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
## 2021-08-04 ENCOUNTER — Encounter: Payer: Self-pay | Admitting: Family Medicine

## 2021-08-04 ENCOUNTER — Ambulatory Visit (INDEPENDENT_AMBULATORY_CARE_PROVIDER_SITE_OTHER): Payer: Medicaid Other | Admitting: Family Medicine

## 2021-08-04 VITALS — BP 118/70 | HR 84 | Temp 98.7°F | Ht 67.0 in | Wt 297.2 lb

## 2021-08-04 DIAGNOSIS — Z130 Encounter for screening for diseases of the blood and blood-forming organs and certain disorders involving the immune mechanism: Secondary | ICD-10-CM

## 2021-08-04 DIAGNOSIS — J301 Allergic rhinitis due to pollen: Secondary | ICD-10-CM

## 2021-08-04 DIAGNOSIS — J309 Allergic rhinitis, unspecified: Secondary | ICD-10-CM | POA: Insufficient documentation

## 2021-08-04 MED ORDER — PREDNISONE 50 MG PO TABS: ORAL_TABLET | ORAL | 0 refills | Status: DC

## 2021-08-04 MED ORDER — CETIRIZINE HCL 10 MG PO TABS: 10.0000 mg | ORAL_TABLET | Freq: Every day | ORAL | 1 refills | Status: DC

## 2021-08-04 MED ORDER — MONTELUKAST SODIUM 10 MG PO TABS: 10.0000 mg | ORAL_TABLET | Freq: Every day | ORAL | 3 refills | Status: DC

## 2021-08-04 MED ORDER — AZELASTINE HCL 0.1 % NA SOLN: 2.0000 | Freq: Two times a day (BID) | NASAL | 6 refills | Status: DC

## 2021-08-04 NOTE — Assessment & Plan Note (Addendum)
Advised healthy diet.

## 2021-08-04 NOTE — Progress Notes (Signed)
? ?Subjective:  ?Patient ID: Alexander Villanueva, male    DOB: Dec 25, 1995  Age: 26 y.o. MRN: [id number] ? ?CC: Establish care, sinus issues ? ?HPI ?Alexander Villanueva is a 26 y.o. male presents to the clinic today to establish care.  He complains of issues with his sinuses. ? ?Patient states that he has had issues with his sinuses since January.  He reports nasal congestion.  He states that he seems to be unable to get it resolved.  He has used Mucinex, DayQuil, and other over-the-counter medications without relief.  No current medications for this.  He states that he has recently quit smoking.  I congratulated him on this.  No recent labs.  No other complaints or concerns at this time. ? ?History reviewed and updated as below. ? ?Past Medical History:  ?Diagnosis Date  ? Concussion   ? ?Past Surgical History:  ?Procedure Laterality Date  ? CHOLECYSTECTOMY N/A 01/01/2021  ? Procedure: LAPAROSCOPIC CHOLECYSTECTOMY WITH INTRAOPERATIVE CHOLANGIOGRAM;  Surgeon: Aviva Signs, MD;  Location: [facility];  Service: General;  Laterality: N/A;  ? LIVER BIOPSY N/A 01/01/2021  ? Procedure: LIVER BIOPSY;  Surgeon: Aviva Signs, MD;  Location: [facility];  Service: General;  Laterality: N/A;  ? ?Social History  ? ?Tobacco Use  ? Smoking status: Former  ?  Packs/day: 1.00  ?  Years: 10.00  ?  Pack years: 10.00  ?  Types: Cigarettes  ?  Quit date: 06/26/2021  ?  Years since quitting: 0.1  ? Smokeless tobacco: Current  ?  Types: Snuff  ?Substance Use Topics  ? Alcohol use: Yes  ?  Alcohol/week: 1.0 standard drink  ?  Types: 1 Cans of beer per week  ?  Comment: occasional  ? ? ?Review of Systems  ?Constitutional: Negative.   ?HENT:  Positive for congestion.   ?Respiratory: Negative.    ?Cardiovascular: Negative.   ? ?Objective:  ? ?Today's Vitals: BP 118/70   Pulse 84   Temp 98.7 ?F (37.1 ?C) (Oral)   Ht 5' 7"  (1.702 m)   Wt 297 lb 3.2 oz (134.8 kg)   SpO2 99%   BMI 46.55 kg/m?  ? ?Physical Exam ?Vitals and nursing note reviewed.   ?Constitutional:   ?   General: He is not in acute distress. ?   Appearance: Normal appearance. He is obese.  ?HENT:  ?   Head: Normocephalic and atraumatic.  ?   Nose: Congestion present.  ?   Right Turbinates: Enlarged.  ?Eyes:  ?   General:     ?   Right eye: No discharge.  ?   Conjunctiva/sclera: Conjunctivae normal.  ?Cardiovascular:  ?   Rate and Rhythm: Normal rate and regular rhythm.  ?Pulmonary:  ?   Effort: Pulmonary effort is normal.  ?   Breath sounds: Normal breath sounds. No wheezing, rhonchi or rales.  ?Abdominal:  ?   General: There is no distension.  ?   Palpations: Abdomen is soft.  ?   Tenderness: There is no abdominal tenderness.  ?Neurological:  ?   Mental Status: He is alert.  ?Psychiatric:     ?   Mood and Affect: Mood normal.     ?   Behavior: Behavior normal.  ? ? ? ?Assessment & Plan:  ? ?Problem List Items Addressed This Visit   ? ?  ? Respiratory  ? Allergic rhinitis - Primary  ?  Given significant congestion and enlarged/swollen turbinates, I am placing him on prednisone.  I  am also starting him on Zyrtec, Singulair, and azelastine nasal spray. ?  ?  ?  ? Other  ? Morbid obesity ([place])  ?  Advised healthy diet. ?  ?  ? Relevant Orders  ? CMP14+EGFR  ? Hemoglobin A1c  ? Lipid panel  ? ?Other Visit Diagnoses   ? ? Screening for deficiency anemia      ? Relevant Orders  ? CBC  ? ?  ? ? ?Meds ordered this encounter  ?Medications  ? predniSONE (DELTASONE) 50 MG tablet  ?  Sig: 1 tablet daily x 5 days  ?  Dispense:  5 tablet  ?  Refill:  0  ? cetirizine (ZYRTEC ALLERGY) 10 MG tablet  ?  Sig: Take 1 tablet (10 mg total) by mouth daily.  ?  Dispense:  90 tablet  ?  Refill:  1  ? montelukast (SINGULAIR) 10 MG tablet  ?  Sig: Take 1 tablet (10 mg total) by mouth at bedtime.  ?  Dispense:  30 tablet  ?  Refill:  3  ? azelastine (ASTELIN) 0.1 % nasal spray  ?  Sig: Place 2 sprays into both nostrils 2 (two) times daily. Use in each nostril as directed  ?  Dispense:  30 mL  ?  Refill:  6   ? ? ? ?Follow-up: Return in about 1 year (around 08/05/2022). ? ?Thersa Salt DO ?[place] ? ? ? ? ?

## 2021-08-04 NOTE — Assessment & Plan Note (Signed)
Given significant congestion and enlarged/swollen turbinates, I am placing him on prednisone.  I am also starting him on Zyrtec, Singulair, and azelastine nasal spray. ?

## 2021-08-04 NOTE — Patient Instructions (Addendum)
Congrats on quitting smoking. ? ?Medications as prescribed. ? ?Call if continues to persist. ? ?Healthy diet. ? ?Follow up annually. ? ?Take care ? ?Dr. Adriana Simas  ?

## 2021-08-05 LAB — CBC
Hematocrit: 42.5 % (ref 37.5–51.0)
Hemoglobin: 14.1 g/dL (ref 13.0–17.7)
MCH: 26.1 pg — ABNORMAL LOW (ref 26.6–33.0)
MCHC: 33.2 g/dL (ref 31.5–35.7)
MCV: 79 fL (ref 79–97)
Platelets: 350 10*3/uL (ref 150–450)
RBC: 5.41 x10E6/uL (ref 4.14–5.80)
RDW: 14.1 % (ref 11.6–15.4)
WBC: 10 10*3/uL (ref 3.4–10.8)

## 2021-08-05 LAB — CMP14+EGFR
ALT: 31 IU/L (ref 0–44)
AST: 22 IU/L (ref 0–40)
Albumin/Globulin Ratio: 1.5 (ref 1.2–2.2)
Albumin: 4.4 g/dL (ref 4.1–5.2)
Alkaline Phosphatase: 74 IU/L (ref 44–121)
BUN/Creatinine Ratio: 14 (ref 9–20)
BUN: 11 mg/dL (ref 6–20)
Bilirubin Total: 0.7 mg/dL (ref 0.0–1.2)
CO2: 22 mmol/L (ref 20–29)
Calcium: 9.5 mg/dL (ref 8.7–10.2)
Chloride: 105 mmol/L (ref 96–106)
Creatinine, Ser: 0.79 mg/dL (ref 0.76–1.27)
Globulin, Total: 2.9 g/dL (ref 1.5–4.5)
Glucose: 98 mg/dL (ref 70–99)
Potassium: 4.6 mmol/L (ref 3.5–5.2)
Sodium: 140 mmol/L (ref 134–144)
Total Protein: 7.3 g/dL (ref 6.0–8.5)
eGFR: 127 mL/min/{1.73_m2} (ref >59)

## 2021-08-05 LAB — LIPID PANEL
Chol/HDL Ratio: 6.7 ratio — ABNORMAL HIGH (ref 0.0–5.0)
Cholesterol, Total: 175 mg/dL (ref 100–199)
HDL: 26 mg/dL — ABNORMAL LOW (ref >39)
LDL Chol Calc (NIH): 124 mg/dL — ABNORMAL HIGH (ref 0–99)
Triglycerides: 139 mg/dL (ref 0–149)
VLDL Cholesterol Cal: 25 mg/dL (ref 5–40)

## 2021-08-05 LAB — HEMOGLOBIN A1C
Est. average glucose Bld gHb Est-mCnc: 117 mg/dL
Hgb A1c MFr Bld: 5.7 % — ABNORMAL HIGH (ref 4.8–5.6)

## 2021-08-09 ENCOUNTER — Encounter (HOSPITAL_COMMUNITY): Payer: Self-pay

## 2021-08-09 ENCOUNTER — Other Ambulatory Visit: Payer: Self-pay

## 2021-08-09 ENCOUNTER — Emergency Department (HOSPITAL_COMMUNITY)
Admission: EM | Admit: 2021-08-09 | Discharge: 2021-08-09 | Disposition: A | Discharge status: Home / Self Care | Payer: Medicaid Other | Attending: Emergency Medicine | Admitting: Emergency Medicine

## 2021-08-09 ENCOUNTER — Ambulatory Visit
Admission: EM | Admit: 2021-08-09 | Discharge: 2021-08-09 | Disposition: A | Discharge status: Home / Self Care | Payer: Medicaid Other | Attending: Urgent Care | Admitting: Urgent Care

## 2021-08-09 DIAGNOSIS — K047 Periapical abscess without sinus: Secondary | ICD-10-CM | POA: Diagnosis not present

## 2021-08-09 DIAGNOSIS — T7840XA Allergy, unspecified, initial encounter: Secondary | ICD-10-CM | POA: Diagnosis not present

## 2021-08-09 DIAGNOSIS — K0889 Other specified disorders of teeth and supporting structures: Secondary | ICD-10-CM | POA: Diagnosis present

## 2021-08-09 DIAGNOSIS — R519 Headache, unspecified: Secondary | ICD-10-CM

## 2021-08-09 DIAGNOSIS — R220 Localized swelling, mass and lump, head: Secondary | ICD-10-CM

## 2021-08-09 DIAGNOSIS — L500 Allergic urticaria: Secondary | ICD-10-CM

## 2021-08-09 MED ORDER — HYDROCODONE-ACETAMINOPHEN 5-325 MG PO TABS
2.0000 | ORAL_TABLET | Freq: Once | ORAL | Status: AC
  Administered 2021-08-09: 2 via ORAL
  Filled 2021-08-09: qty 2

## 2021-08-09 MED ORDER — HYDROCODONE-ACETAMINOPHEN 5-325 MG PO TABS: 2.0000 | ORAL_TABLET | ORAL | 0 refills | Status: DC | PRN

## 2021-08-09 MED ORDER — NAPROXEN 500 MG PO TABS: 500.0000 mg | ORAL_TABLET | Freq: Two times a day (BID) | ORAL | 0 refills | Status: DC

## 2021-08-09 MED ORDER — AMOXICILLIN-POT CLAVULANATE 875-125 MG PO TABS: 1.0000 | ORAL_TABLET | Freq: Two times a day (BID) | ORAL | 0 refills | Status: DC

## 2021-08-09 MED ORDER — HYDROXYZINE HCL 25 MG PO TABS: 12.5000 mg | ORAL_TABLET | Freq: Three times a day (TID) | ORAL | 0 refills | Status: DC | PRN

## 2021-08-09 NOTE — ED Triage Notes (Signed)
Pt here with dental pain that started Wed, was seen at urgent care today and started on anbx and naproxen, pt says the pain meds is not strong enough ?

## 2021-08-09 NOTE — ED Provider Notes (Signed)
?[facility] ? ? ?MRN: [id number] DOB: 01-14-1996 ? ?Subjective:  ? ?Alexander Villanueva is a 26 y.o. male presenting for 5 day history of recurrent left facial pain, swelling, dental pain.  Patient has previously been advised that he needs some dental work but was not able to get in with his dentist this past week.  He did recently start 4 medications for his allergies and actually ended up getting urticaria from one of them.  He ended up stopping the azelastine nasal spray, Zyrtec, Flexeril, montelukast and prednisone.  Has been using Benadryl and has been improving.  Still has urticarial lesions over the left side of his neck.  No difficulty with his breathing, wheezing, nausea, vomiting, tongue swelling. ? ?No current facility-administered medications for this encounter. ? ?Current Outpatient Medications:  ?  acetaminophen (TYLENOL) 325 MG tablet, Take 2 tablets (650 mg total) by mouth every 6 (six) hours as needed for up to 30 doses for moderate pain or mild pain., Disp: 30 tablet, Rfl: 0 ?  azelastine (ASTELIN) 0.1 % nasal spray, Place 2 sprays into both nostrils 2 (two) times daily. Use in each nostril as directed, Disp: 30 mL, Rfl: 6 ?  cetirizine (ZYRTEC ALLERGY) 10 MG tablet, Take 1 tablet (10 mg total) by mouth daily., Disp: 90 tablet, Rfl: 1 ?  cyclobenzaprine (FLEXERIL) 10 MG tablet, Take 1 tablet (10 mg total) by mouth 2 (two) times daily as needed for up to 20 doses for muscle spasms., Disp: 20 tablet, Rfl: 0 ?  ibuprofen (ADVIL) 800 MG tablet, Take 1 tablet (800 mg total) by mouth every 8 (eight) hours as needed for up to 30 doses., Disp: 30 tablet, Rfl: 0 ?  montelukast (SINGULAIR) 10 MG tablet, Take 1 tablet (10 mg total) by mouth at bedtime., Disp: 30 tablet, Rfl: 3 ?  predniSONE (DELTASONE) 50 MG tablet, 1 tablet daily x 5 days, Disp: 5 tablet, Rfl: 0  ? ?No Known Allergies ? ?Past Medical History:  ?Diagnosis Date  ? Concussion   ?  ? ?Past Surgical History:  ?Procedure Laterality  Date  ? CHOLECYSTECTOMY N/A 01/01/2021  ? Procedure: LAPAROSCOPIC CHOLECYSTECTOMY WITH INTRAOPERATIVE CHOLANGIOGRAM;  Surgeon: Franky Macho, MD;  Location: [facility];  Service: General;  Laterality: N/A;  ? LIVER BIOPSY N/A 01/01/2021  ? Procedure: LIVER BIOPSY;  Surgeon: Franky Macho, MD;  Location: [facility];  Service: General;  Laterality: N/A;  ? ? ?History reviewed. No pertinent family history. ? ?Social History  ? ?Tobacco Use  ? Smoking status: Former  ?  Packs/day: 1.00  ?  Years: 10.00  ?  Pack years: 10.00  ?  Types: Cigarettes  ?  Quit date: 06/26/2021  ?  Years since quitting: 0.1  ? Smokeless tobacco: Current  ?  Types: Snuff  ?Vaping Use  ? Vaping Use: Former  ?Substance Use Topics  ? Alcohol use: Yes  ?  Alcohol/week: 1.0 standard drink  ?  Types: 1 Cans of beer per week  ?  Comment: occasional  ? Drug use: No  ? ? ?ROS ? ? ?Objective:  ? ?Vitals: ?BP 138/83 (BP Location: Right Arm)   Pulse 70   Temp 98 ?F (36.7 ?C) (Oral)   Resp 18   SpO2 99%  ? ?Physical Exam ?Constitutional:   ?   General: He is not in acute distress. ?   Appearance: Normal appearance. He is well-developed and normal weight. He is not ill-appearing, toxic-appearing or diaphoretic.  ?HENT:  ?  Head: Normocephalic and atraumatic.  ? ?   Right Ear: External ear normal.  ?   Left Ear: External ear normal.  ?   Nose: Nose normal.  ?   Mouth/Throat:  ?   Mouth: Mucous membranes are moist.  ?   Pharynx: No pharyngeal swelling, oropharyngeal exudate, posterior oropharyngeal erythema or uvula swelling.  ?   Tonsils: No tonsillar exudate or tonsillar abscesses. 0 on the right. 0 on the left.  ? ?   Comments: Airway is patent.  Patient is controlling secretions and speaking in full sentences. ?Eyes:  ?   General: No scleral icterus.    ?   Right eye: No discharge.     ?   Left eye: No discharge.  ?   Extraocular Movements: Extraocular movements intact.  ?Cardiovascular:  ?   Rate and Rhythm: Normal rate and regular rhythm.  ?   Heart sounds:  Normal heart sounds. No murmur heard. ?  No friction rub. No gallop.  ?Pulmonary:  ?   Effort: Pulmonary effort is normal. No respiratory distress.  ?   Breath sounds: Normal breath sounds. No stridor. No wheezing, rhonchi or rales.  ?Musculoskeletal:  ?   Cervical back: Normal range of motion.  ?Skin: ?   Findings: Rash (urticarial lesions scattered over the left side of his neck extending across the front part of his chest) present.  ?Neurological:  ?   Mental Status: He is alert and oriented to person, place, and time.  ?Psychiatric:     ?   Mood and Affect: Mood normal.     ?   Behavior: Behavior normal.     ?   Thought Content: Thought content normal.     ?   Judgment: Judgment normal.  ? ? ? ?Assessment and Plan :  ? ?PDMP not reviewed this encounter. ? ?1. Dental infection   ?2. Pain, dental   ?3. Facial pain   ?4. Facial swelling   ?5. Allergic reaction, initial encounter   ?6. Allergic urticaria   ? ?Start Augmentin for dental infection/abscess, use naproxen for pain and inflammation. Emphasized need for dental surgeon consult.  I suspect he is going through a concurrent allergic urticaria from other medications he recently started.  We will defer further steroid use, recommended hydroxyzine.  No signs of an anaphylactic reaction.  Counseled patient on potential for adverse effects with medications prescribed/recommended today, strict ER and return-to-clinic precautions discussed, patient verbalized understanding. ? ?  ?Wallis Bamberg, PA-C ?08/09/21 1002 ? ?

## 2021-08-09 NOTE — Discharge Instructions (Addendum)
Please follow-up with dentistry for further evaluation.  If symptoms stronger pain medication to your pharmacy.  Return to the emergency department for any worsening symptoms you might have. ?

## 2021-08-09 NOTE — ED Triage Notes (Signed)
Pt reports dental  pain x 5 days. Tylenol and ibuprofen gives no relief.  ?

## 2021-08-09 NOTE — Discharge Instructions (Addendum)
Make sure you schedule an appointment with a dentist/dental surgeon as soon as possible.  You may try some of the resources below.    [facility] in [place], [place] Address: [address], [place], [place] [address] Phone: [phone]  [facility] [phone] extension [phone]  Dr. Civils [phone] [address]  [facility] [phone] [address]  [facility] [phone] extension [phone] [place], [place], [address] First come first serve for the first 10 clients.  May do simple extractions only, no wisdom teeth or surgery.  You may try the second for Thursday of the month starting at 6:30 AM.  [employer] You may call the school to see if they are still helping to provide dental care for emergent cases.  

## 2021-08-09 NOTE — ED Provider Notes (Signed)
?[facility] EMERGENCY DEPARTMENT ?Provider Note ? ? ?CSN: [id number] ?Arrival date & time: 08/09/21  2116 ? ?  ? ?History ?Chief Complaint  ?Patient presents with  ? Dental Pain  ? ? ?Alexander Villanueva is a 26 y.o. male who presents the emergency department with dental pain.  Patient was seen evaluated at urgent care for similar symptoms.  He was prescribed Augmentin and naproxen for dental abscess.  He states that naproxen is not handling his pain.  He is spoken with a dentist earlier this week and then did not get phone calls back to schedule an appointment.  He is going to call on Monday.  Just wants stronger pain medication to help manage his pain better.  No fever or chills.  No trouble swallowing or breathing. ? ? ?Dental Pain ? ?  ? ?Home Medications ?Prior to Admission medications   ?Medication Sig Start Date End Date Taking? Authorizing Provider  ?HYDROcodone-acetaminophen (NORCO/VICODIN) 5-325 MG tablet Take 2 tablets by mouth every 4 (four) hours as needed. 08/09/21  Yes Honor Loh M, PA-C  ?acetaminophen (TYLENOL) 325 MG tablet Take 2 tablets (650 mg total) by mouth every 6 (six) hours as needed for up to 30 doses for moderate pain or mild pain. 07/26/21   Terald Sleeper, MD  ?amoxicillin-clavulanate (AUGMENTIN) 875-125 MG tablet Take 1 tablet by mouth 2 (two) times daily. 08/09/21   Wallis Bamberg, PA-C  ?azelastine (ASTELIN) 0.1 % nasal spray Place 2 sprays into both nostrils 2 (two) times daily. Use in each nostril as directed 08/04/21   Tommie Sams, DO  ?cetirizine (ZYRTEC ALLERGY) 10 MG tablet Take 1 tablet (10 mg total) by mouth daily. 08/04/21   Tommie Sams, DO  ?cyclobenzaprine (FLEXERIL) 10 MG tablet Take 1 tablet (10 mg total) by mouth 2 (two) times daily as needed for up to 20 doses for muscle spasms. 07/26/21   Terald Sleeper, MD  ?hydrOXYzine (ATARAX) 25 MG tablet Take 0.5-1 tablets (12.5-25 mg total) by mouth every 8 (eight) hours as needed for itching. 08/09/21   Wallis Bamberg, PA-C   ?ibuprofen (ADVIL) 800 MG tablet Take 1 tablet (800 mg total) by mouth every 8 (eight) hours as needed for up to 30 doses. 07/26/21   Terald Sleeper, MD  ?montelukast (SINGULAIR) 10 MG tablet Take 1 tablet (10 mg total) by mouth at bedtime. 08/04/21   Tommie Sams, DO  ?naproxen (NAPROSYN) 500 MG tablet Take 1 tablet (500 mg total) by mouth 2 (two) times daily with a meal. 08/09/21   Wallis Bamberg, PA-C  ?predniSONE (DELTASONE) 50 MG tablet 1 tablet daily x 5 days 08/04/21   Tommie Sams, DO  ?   ? ?Allergies    ?Patient has no known allergies.   ? ?Review of Systems   ?Review of Systems  ?All other systems reviewed and are negative. ? ?Physical Exam ?Updated Vital Signs ?BP 130/89 (BP Location: Right Arm)   Pulse 74   Temp 97.6 ?F (36.4 ?C) (Oral)   Resp 18   Ht 5\' 7"  (1.702 m)   Wt 134.8 kg   SpO2 100%   BMI 46.55 kg/m?  ?Physical Exam ?Vitals and nursing note reviewed.  ?Constitutional:   ?   Appearance: Normal appearance.  ?HENT:  ?   Head: Normocephalic and atraumatic.  ?   Mouth/Throat:  ?   Comments: Posterior pharynx is clear without any signs of exudate or erythema.  There are evidence of 3 broken molars  in the left lower gumline with swelling.  No swelling of the tongue or oral floor.  No obvious fluctuance. ?Eyes:  ?   General:     ?   Right eye: No discharge.     ?   Left eye: No discharge.  ?   Conjunctiva/sclera: Conjunctivae normal.  ?Pulmonary:  ?   Effort: Pulmonary effort is normal.  ?Skin: ?   General: Skin is warm and dry.  ?   Findings: No rash.  ?Neurological:  ?   General: No focal deficit present.  ?   Mental Status: He is alert.  ?Psychiatric:     ?   Mood and Affect: Mood normal.     ?   Behavior: Behavior normal.  ? ? ?ED Results / Procedures / Treatments   ?Labs ?(all labs ordered are listed, but only abnormal results are displayed) ?Labs Reviewed - No data to display ? ?EKG ?None ? ?Radiology ?No results found. ? ?Procedures ?Procedures  ? ? ?Medications Ordered in ED ?Medications   ?HYDROcodone-acetaminophen (NORCO/VICODIN) 5-325 MG per tablet 2 tablet (2 tablets Oral Given 08/09/21 2209)  ? ? ?ED Course/ Medical Decision Making/ A&P ?  ?                        ?Medical Decision Making ?Risk ?Prescription drug management. ? ? ?Alexander Villanueva is a 26 y.o. male who presents to the emergency department with dental pain.  Patient was given 2 Vicodin in the department today.  I will send him home with several pills of Vicodin until he can follow-up with dentistry sometime next week.  Patient amenable this plan.  He is safe for discharge.  No evidence of emergent pathology including Ludwick's angina, PTA, RPA, facial cellulitis. ? ?Final Clinical Impression(s) / ED Diagnoses ?Final diagnoses:  ?Pain, dental  ? ? ?Rx / DC Orders ?ED Discharge Orders   ? ?      Ordered  ?  HYDROcodone-acetaminophen (NORCO/VICODIN) 5-325 MG tablet  Every 4 hours PRN       ? 08/09/21 2207  ? ?  ?  ? ?  ? ? ?  ?Teressa Lower, PA-C ?08/09/21 2221 ? ?  ?Benjiman Core, MD ?08/09/21 2351 ? ?

## 2021-09-17 ENCOUNTER — Other Ambulatory Visit (INDEPENDENT_AMBULATORY_CARE_PROVIDER_SITE_OTHER): Payer: Medicaid Other | Admitting: *Deleted

## 2021-09-17 DIAGNOSIS — Z111 Encounter for screening for respiratory tuberculosis: Secondary | ICD-10-CM | POA: Diagnosis not present

## 2021-09-19 ENCOUNTER — Ambulatory Visit: Payer: Medicaid Other

## 2021-09-23 ENCOUNTER — Ambulatory Visit: Payer: Medicaid Other

## 2021-12-03 ENCOUNTER — Ambulatory Visit (INDEPENDENT_AMBULATORY_CARE_PROVIDER_SITE_OTHER): Payer: Medicaid Other | Admitting: Family Medicine

## 2021-12-03 ENCOUNTER — Ambulatory Visit (HOSPITAL_COMMUNITY)
Admission: RE | Admit: 2021-12-03 | Discharge: 2021-12-03 | Disposition: A | Discharge status: Home / Self Care | Payer: Medicaid Other | Source: Ambulatory Visit | Attending: Family Medicine | Admitting: Family Medicine

## 2021-12-03 ENCOUNTER — Encounter: Payer: Self-pay | Admitting: Family Medicine

## 2021-12-03 VITALS — BP 118/68 | HR 76 | Ht 67.0 in | Wt 291.6 lb

## 2021-12-03 DIAGNOSIS — G8929 Other chronic pain: Secondary | ICD-10-CM | POA: Diagnosis present

## 2021-12-03 DIAGNOSIS — M25562 Pain in left knee: Secondary | ICD-10-CM | POA: Diagnosis present

## 2021-12-03 MED ORDER — MELOXICAM 15 MG PO TABS: 15.0000 mg | ORAL_TABLET | Freq: Every day | ORAL | 0 refills | Status: DC

## 2021-12-03 NOTE — Patient Instructions (Signed)
Xray today. ° °Medication as prescribed. ° °We will call with results. ° °Take care ° °Dr. Khalidah Herbold  °

## 2021-12-04 DIAGNOSIS — G8929 Other chronic pain: Secondary | ICD-10-CM | POA: Insufficient documentation

## 2021-12-04 NOTE — Progress Notes (Signed)
Subjective:  Patient ID: Alexander Villanueva, male    DOB: 1995/11/17  Age: 26 y.o. MRN: [id number]  CC: Chief Complaint  Patient presents with   Knee Problem    Left knee been bothering for about a month. Knee gave it out and then had pain afterwards. Still will give out once in a while.    HPI:  26 year old male presents for evaluation the above.  Patient reports left knee pain over the past month.  He states that he initially bent down and then developed left knee pain.  He states that the pain has been progressively getting worse.  States that occasionally he feels like the knee is going to give way.  Denies any direct trauma or direct injury.  No relieving factors.  Seems to be exacerbated by activity.  Patient Active Problem List   Diagnosis Date Noted   Chronic pain of left knee 12/04/2021   Morbid obesity (HCC) 08/04/2021   Allergic rhinitis 08/04/2021    Social Hx   Social History   Socioeconomic History   Marital status: Married    Spouse name: Not on file   Number of children: Not on file   Years of education: Not on file   Highest education level: Not on file  Occupational History   Not on file  Tobacco Use   Smoking status: Former    Packs/day: 1.00    Years: 10.00    Total pack years: 10.00    Types: Cigarettes    Quit date: 06/26/2021    Years since quitting: 0.4   Smokeless tobacco: Current    Types: Snuff  Vaping Use   Vaping Use: Former  Substance and Sexual Activity   Alcohol use: Yes    Alcohol/week: 1.0 standard drink of alcohol    Types: 1 Cans of beer per week    Comment: occasional   Drug use: No   Sexual activity: Yes  Other Topics Concern   Not on file  Social History Narrative   Not on file   Social Determinants of Health   Financial Resource Strain: Not on file  Food Insecurity: Not on file  Transportation Needs: Not on file  Physical Activity: Not on file  Stress: Not on file  Social Connections: Not on file    Review of  Systems Per HPI  Objective:  BP 118/68   Pulse 76   Ht 5\' 7"  (1.702 m)   Wt 291 lb 9.6 oz (132.3 kg)   SpO2 98%   BMI 45.67 kg/m      12/03/2021    3:10 PM 08/09/2021    9:40 PM 08/09/2021    9:39 PM  BP/Weight  Systolic BP 118  08/11/2021  Diastolic BP 68  89  Wt. (Lbs) 291.6 297.2   BMI 45.67 kg/m2 46.55 kg/m2     Physical Exam Vitals and nursing note reviewed.  Constitutional:      Appearance: Normal appearance. He is obese.  HENT:     Head: Normocephalic and atraumatic.  Pulmonary:     Effort: Pulmonary effort is normal. No respiratory distress.  Musculoskeletal:     Comments: Left knee -ligaments intact.  No appreciable effusion on exam.  No discrete areas of tenderness.  Neurological:     Mental Status: He is alert.  Psychiatric:        Mood and Affect: Mood normal.        Behavior: Behavior normal.     Lab Results  Component Value  Date   WBC 10.0 08/04/2021   HGB 14.1 08/04/2021   HCT 42.5 08/04/2021   PLT 350 08/04/2021   GLUCOSE 98 08/04/2021   CHOL 175 08/04/2021   TRIG 139 08/04/2021   HDL 26 (L) 08/04/2021   LDLCALC 124 (H) 08/04/2021   ALT 31 08/04/2021   AST 22 08/04/2021   NA 140 08/04/2021   K 4.6 08/04/2021   CL 105 08/04/2021   CREATININE 0.79 08/04/2021   BUN 11 08/04/2021   CO2 22 08/04/2021   HGBA1C 5.7 (H) 08/04/2021     Assessment & Plan:   Problem List Items Addressed This Visit       Other   Chronic pain of left knee - Primary    Xray obtained and was independently interpreted by me.  Interpretation: No apparent fracture.  Small joint effusion. Advised rest, elevation.  Meloxicam as directed.  If continues to persist, will need to see orthopedics.      Relevant Medications   meloxicam (MOBIC) 15 MG tablet   Other Relevant Orders   DG Knee Complete 4 Views Left (Completed)    Meds ordered this encounter  Medications   meloxicam (MOBIC) 15 MG tablet    Sig: Take 1 tablet (15 mg total) by mouth daily.    Dispense:  30  tablet    Refill:  0    Meka Lewan DO [facility]

## 2021-12-04 NOTE — Assessment & Plan Note (Signed)
Xray obtained and was independently interpreted by me.  Interpretation: No apparent fracture.  Small joint effusion. Advised rest, elevation.  Meloxicam as directed.  If continues to persist, will need to see orthopedics.

## 2022-07-09 ENCOUNTER — Other Ambulatory Visit: Payer: Self-pay

## 2022-07-09 ENCOUNTER — Encounter (HOSPITAL_COMMUNITY): Payer: Self-pay

## 2022-07-09 ENCOUNTER — Emergency Department (HOSPITAL_COMMUNITY)
Admission: EM | Admit: 2022-07-09 | Discharge: 2022-07-09 | Disposition: A | Discharge status: Home / Self Care | Payer: Medicaid Other | Attending: Emergency Medicine | Admitting: Emergency Medicine

## 2022-07-09 DIAGNOSIS — S0501XA Injury of conjunctiva and corneal abrasion without foreign body, right eye, initial encounter: Secondary | ICD-10-CM | POA: Insufficient documentation

## 2022-07-09 DIAGNOSIS — X58XXXA Exposure to other specified factors, initial encounter: Secondary | ICD-10-CM | POA: Insufficient documentation

## 2022-07-09 DIAGNOSIS — H53141 Visual discomfort, right eye: Secondary | ICD-10-CM | POA: Diagnosis present

## 2022-07-09 DIAGNOSIS — Y990 Civilian activity done for income or pay: Secondary | ICD-10-CM | POA: Insufficient documentation

## 2022-07-09 MED ORDER — TETRACAINE HCL 0.5 % OP SOLN
1.0000 [drp] | Freq: Once | OPHTHALMIC | Status: AC
  Administered 2022-07-09: 1 [drp] via OPHTHALMIC
  Filled 2022-07-09: qty 4

## 2022-07-09 MED ORDER — FLUORESCEIN SODIUM 1 MG OP STRP
1.0000 | ORAL_STRIP | Freq: Once | OPHTHALMIC | Status: AC
  Administered 2022-07-09: 1 via OPHTHALMIC
  Filled 2022-07-09: qty 1

## 2022-07-09 MED ORDER — ERYTHROMYCIN 5 MG/GM OP OINT
TOPICAL_OINTMENT | Freq: Four times a day (QID) | OPHTHALMIC | Status: DC
  Administered 2022-07-09: 1 via OPHTHALMIC
  Filled 2022-07-09: qty 3.5

## 2022-07-09 NOTE — ED Triage Notes (Signed)
Pt cut a piece of wood around 1 pm and a piece of something went in his right eye. He has tried to irrigate it but it still feels like something is in there.

## 2022-07-09 NOTE — Discharge Instructions (Signed)
Please be sure to use the provided erythromycin ointment every 4 hours until you have seen an ophthalmologist, then as directed.  Return here for concerning changes in your condition.

## 2022-07-09 NOTE — ED Provider Notes (Signed)
[facility] Provider Note   CSN: [id number] Arrival date & time: 07/09/22  1839     History  Chief Complaint  Patient presents with   Eye Pain    Alexander Villanueva is a 27 y.o. male.  HPI Presents with his wife who assists with the history.  He is generally well, was so prior to just before ED arrival.  He notes that he was cutting wood with a circular saw when he felt sudden onset of discomfort in his right eye.  Since that time he had photophobia, pain with eye motion, no true vision loss, though he notes he is in need of glasses he has baseline blurry vision, he does not wear them, nor contact lenses. No other injuries, no other complaints.    Home Medications Prior to Admission medications   Medication Sig Start Date End Date Taking? Authorizing Provider  acetaminophen (TYLENOL) 325 MG tablet Take 2 tablets (650 mg total) by mouth every 6 (six) hours as needed for up to 30 doses for moderate pain or mild pain. 07/26/21   Wyvonnia Dusky, MD  meloxicam (MOBIC) 15 MG tablet Take 1 tablet (15 mg total) by mouth daily. 12/03/21   Coral Spikes, DO      Allergies    Patient has no known allergies.    Review of Systems   Review of Systems  All other systems reviewed and are negative.   Physical Exam Updated Vital Signs BP 130/80   Pulse 84   Temp 98.1 F (36.7 C) (Oral)   Resp 16   Ht 5\' 7"  (1.702 m)   Wt (!) 140.6 kg   SpO2 96%   BMI 48.55 kg/m  Physical Exam Vitals and nursing note reviewed.  Constitutional:      General: He is not in acute distress.    Appearance: He is well-developed.  HENT:     Head: Normocephalic and atraumatic.  Eyes:     General: Lids are normal.     Extraocular Movements:     Right eye: Normal extraocular motion and no nystagmus.     Left eye: Normal extraocular motion and no nystagmus.     Conjunctiva/sclera:     Right eye: Chemosis present.     Pupils: Pupils are equal, round, and  reactive to light.     Comments: Lids everted.  There was a fibrous appearing piece of foreign material removed from the lateral aspect of the eye.  With fluorescein staining the patient had substantial uptake in multiple areas, most prominently in the area just lateral to the pupil.  No obvious disruption of the globe.  Symptoms resolved with fluorescein.  Pulmonary:     Effort: Pulmonary effort is normal. No respiratory distress.     Breath sounds: No stridor.  Abdominal:     General: There is no distension.  Skin:    General: Skin is warm and dry.  Neurological:     Mental Status: He is alert and oriented to person, place, and time.  Psychiatric:        Mood and Affect: Mood normal.     ED Results / Procedures / Treatments   Labs (all labs ordered are listed, but only abnormal results are displayed) Labs Reviewed - No data to display  EKG None  Radiology No results found.  Procedures Procedures    Medications Ordered in ED Medications  erythromycin ophthalmic ointment (1 Application Right Eye Given 07/09/22 2022)  fluorescein ophthalmic strip 1 strip (1 strip Both Eyes Given 07/09/22 1953)  tetracaine (PONTOCAINE) 0.5 % ophthalmic solution 1 drop (1 drop Right Eye Given 07/09/22 1954)    ED Course/ Medical Decision Making/ A&P                             Medical Decision Making Adult male presents with eye pain after sustaining a possible injury while cutting wood with a circular saw.  Concern for corneal abrasion versus globe rupture less likely deep lesion.  No other injury, no other complaints.  Patient had exam as above with fluorescein staining consistent with corneal abrasion, removal of small foreign object.  With resolution of pain, seemingly appropriate visual acuity though he has no recent studies, the patient was discharged to follow-up tomorrow with his ophthalmologist with ongoing erythromycin ointment.  Amount and/or Complexity of Data Reviewed Independent  Historian: spouse ECG/medicine tests: independent interpretation performed. Decision-making details documented in ED Course.  Risk Prescription drug management. Decision regarding hospitalization.    12:08 AM 20/40 both eyes       Final Clinical Impression(s) / ED Diagnoses Final diagnoses:  Abrasion of right cornea, initial encounter    Rx / DC Orders ED Discharge Orders     None         Carmin Muskrat, MD 07/10/22 0008

## 2022-07-10 ENCOUNTER — Telehealth: Payer: Self-pay

## 2022-07-10 NOTE — Transitions of Care (Post Inpatient/ED Visit) (Signed)
   07/10/2022  Name: Alexander Villanueva MRN: [id number] DOB: 1996-04-14  Today's TOC FU Call Status: Today's TOC FU Call Status:: Successful TOC FU Call Competed TOC FU Call Complete Date: 07/10/22  Transition Care Management Follow-up Telephone Call Date of Discharge: 07/09/22 Discharge [facility] Type of Discharge: Emergency Department Reason for ED Visit: Other: (injury of conjuction) How have you been since you were released from the hospital?: Same Any questions or concerns?: No  Items Reviewed: Did you receive and understand the discharge instructions provided?: Yes Medications obtained and verified?: Yes (Medications Reviewed) Any new allergies since your discharge?: No Dietary orders reviewed?: NA Do you have support at home?: Yes People in Home: spouse  Home Care and Equipment/Supplies: [place] Ordered?: NA Any new equipment or medical supplies ordered?: NA  Functional Questionnaire: Do you need assistance with bathing/showering or dressing?: No Do you need assistance with meal preparation?: No Do you need assistance with eating?: No Do you have difficulty maintaining continence: No Do you need assistance with getting out of bed/getting out of a chair/moving?: No Do you have difficulty managing or taking your medications?: No  Folllow up appointments reviewed: PCP Follow-up appointment confirmed?: NA Specialist Hospital Follow-up appointment confirmed?: Yes Date of Specialist follow-up appointment?: 07/10/22 Follow-Up Specialty Provider:: eye specialist Do you need transportation to your follow-up appointment?: No Do you understand care options if your condition(s) worsen?: Yes-patient verbalized understanding    SIGNATURE Juanda Crumble, [place] Nurse Health Advisor Direct Dial [phone]

## 2022-10-26 ENCOUNTER — Ambulatory Visit: Payer: Medicaid Other | Admitting: Family Medicine

## 2022-10-26 VITALS — BP 121/83 | HR 84 | Temp 98.8°F | Ht 67.0 in | Wt 306.2 lb

## 2022-10-26 DIAGNOSIS — G8929 Other chronic pain: Secondary | ICD-10-CM | POA: Diagnosis not present

## 2022-10-26 DIAGNOSIS — M25562 Pain in left knee: Secondary | ICD-10-CM | POA: Diagnosis not present

## 2022-10-26 MED ORDER — TRAMADOL HCL 50 MG PO TABS: 50.0000 mg | ORAL_TABLET | Freq: Two times a day (BID) | ORAL | 0 refills | Status: AC | PRN

## 2022-10-26 NOTE — Progress Notes (Signed)
Subjective:  Patient ID: Alexander Villanueva, male    DOB: November 29, 1995  Age: 27 y.o. MRN: [id number]  CC: Chief Complaint  Patient presents with   Knee Pain    PT Stated he has a painful knot on the left side of his left knee, that has been getting worse over time.    HPI:  27 year old male with morbid obesity presents for evaluation of chronic left knee pain.  This is a chronic and ongoing issue.  I last saw him for this on 12/03/2021.  X-ray at that time revealed small effusion.  No other significant findings.  He reports that he continues to have left knee pain.  Mostly lateral.  Worse with prolonged sitting and then subsequently getting up.  Reports decreased range of motion.  He is not currently taking any medication for this.  He states that he does not like to take medication.  Pain is worse at night.  No relieving factors.  Patient Active Problem List   Diagnosis Date Noted   Chronic pain of left knee 12/04/2021   Morbid obesity (HCC) 08/04/2021   Allergic rhinitis 08/04/2021    Social Hx   Social History   Socioeconomic History   Marital status: Married    Spouse name: Not on file   Number of children: Not on file   Years of education: Not on file   Highest education level: Not on file  Occupational History   Not on file  Tobacco Use   Smoking status: Former    Packs/day: 1.00    Years: 10.00    Additional pack years: 0.00    Total pack years: 10.00    Types: Cigarettes    Quit date: 06/26/2021    Years since quitting: 1.3   Smokeless tobacco: Current    Types: Snuff  Vaping Use   Vaping Use: Former  Substance and Sexual Activity   Alcohol use: Yes    Alcohol/week: 1.0 standard drink of alcohol    Types: 1 Cans of beer per week    Comment: occasional   Drug use: No   Sexual activity: Yes  Other Topics Concern   Not on file  Social History Narrative   Not on file   Social Determinants of Health   Financial Resource Strain: Not on file  Food  Insecurity: Not on file  Transportation Needs: Not on file  Physical Activity: Not on file  Stress: Not on file  Social Connections: Not on file    Review of Systems Per HPI  Objective:  BP 121/83   Pulse 84   Temp 98.8 F (37.1 C)   Ht 5\' 7"  (1.702 m)   Wt (!) 306 lb 3.2 oz (138.9 kg)   SpO2 100%   BMI 47.96 kg/m      10/26/2022    2:28 PM 07/09/2022    8:22 PM 07/09/2022    7:13 PM  BP/Weight  Systolic BP 121 130   Diastolic BP 83 80   Wt. (Lbs) 306.2  310  BMI 47.96 kg/m2  48.55 kg/m2    Physical Exam Constitutional:      General: He is not in acute distress.    Appearance: Normal appearance. He is obese.  HENT:     Head: Normocephalic and atraumatic.  Pulmonary:     Effort: Pulmonary effort is normal. No respiratory distress.  Musculoskeletal:     Comments: Left knee -ligaments intact.  No joint line tenderness on exam today.  Neurological:  Mental Status: He is alert.  Psychiatric:        Mood and Affect: Mood normal.        Behavior: Behavior normal.     Lab Results  Component Value Date   WBC 10.0 08/04/2021   HGB 14.1 08/04/2021   HCT 42.5 08/04/2021   PLT 350 08/04/2021   GLUCOSE 98 08/04/2021   CHOL 175 08/04/2021   TRIG 139 08/04/2021   HDL 26 (L) 08/04/2021   LDLCALC 124 (H) 08/04/2021   ALT 31 08/04/2021   AST 22 08/04/2021   NA 140 08/04/2021   K 4.6 08/04/2021   CL 105 08/04/2021   CREATININE 0.79 08/04/2021   BUN 11 08/04/2021   CO2 22 08/04/2021   HGBA1C 5.7 (H) 08/04/2021     Assessment & Plan:   Problem List Items Addressed This Visit       Other   Chronic pain of left knee - Primary    Chronic problem, worsening/uncontrolled.  Referring to orthopedics.  Will likely need MRI.  Given significant pain at night will give tramadol.      Relevant Medications   traMADol (ULTRAM) 50 MG tablet   Other Relevant Orders   Ambulatory referral to Orthopedic Surgery    Meds ordered this encounter  Medications   traMADol  (ULTRAM) 50 MG tablet    Sig: Take 1 tablet (50 mg total) by mouth every 12 (twelve) hours as needed.    Dispense:  10 tablet    Refill:  0    Follow-up:  Return if symptoms worsen or fail to improve.  Everlene Other DO [facility]

## 2022-10-26 NOTE — Assessment & Plan Note (Addendum)
Chronic problem, worsening/uncontrolled.  Referring to orthopedics.  Will likely need MRI.  Given significant pain at night will give tramadol.

## 2022-10-26 NOTE — Patient Instructions (Signed)
Rest.  Medication as directed.  Referral placed.  Address: [address] [place], [place] [address] Hours:  Open ? Closes 5?PM Phone: [phone]

## 2023-11-05 ENCOUNTER — Encounter: Payer: Self-pay | Admitting: Nurse Practitioner

## 2023-11-24 ENCOUNTER — Encounter: Payer: Self-pay | Admitting: Nurse Practitioner

## 2023-11-30 ENCOUNTER — Encounter: Payer: Self-pay | Admitting: Nurse Practitioner

## 2023-12-08 ENCOUNTER — Encounter: Payer: Self-pay | Admitting: Family Medicine

## 2023-12-14 ENCOUNTER — Encounter: Payer: Self-pay | Admitting: Family Medicine

## 2024-05-11 ENCOUNTER — Ambulatory Visit: Admitting: Family Medicine

## 2024-06-09 ENCOUNTER — Encounter: Admitting: Family Medicine
